# Patient Record
Sex: Male | Born: 1985 | Race: Black or African American | Hispanic: No | Marital: Single | Smoking: Never smoker
Health system: Southern US, Community
[De-identification: ages and names within clinical notes are randomized; demographics above are authoritative.]

## PROBLEM LIST (undated history)

## (undated) DIAGNOSIS — Z21 Asymptomatic human immunodeficiency virus [HIV] infection status: Secondary | ICD-10-CM

## (undated) DIAGNOSIS — B20 Human immunodeficiency virus [HIV] disease: Secondary | ICD-10-CM

## (undated) HISTORY — PX: TONSILLECTOMY: SUR1361

## (undated) HISTORY — PX: CIRCUMCISION: SUR203

## (undated) HISTORY — PX: INTUSSUSCEPTION REPAIR: SHX1847

---

## 2004-08-26 ENCOUNTER — Ambulatory Visit: Payer: Self-pay | Admitting: Family Medicine

## 2004-12-04 ENCOUNTER — Ambulatory Visit: Payer: Self-pay | Admitting: Family Medicine

## 2008-01-25 ENCOUNTER — Emergency Department (HOSPITAL_COMMUNITY): Admission: EM | Admit: 2008-01-25 | Discharge: 2008-01-26 | Payer: Self-pay | Admitting: Internal Medicine

## 2008-06-07 ENCOUNTER — Emergency Department (HOSPITAL_BASED_OUTPATIENT_CLINIC_OR_DEPARTMENT_OTHER): Admission: EM | Admit: 2008-06-07 | Discharge: 2008-06-07 | Payer: Self-pay | Admitting: Emergency Medicine

## 2008-07-07 ENCOUNTER — Emergency Department (HOSPITAL_BASED_OUTPATIENT_CLINIC_OR_DEPARTMENT_OTHER): Admission: EM | Admit: 2008-07-07 | Discharge: 2008-07-07 | Payer: Self-pay | Admitting: Emergency Medicine

## 2009-04-29 ENCOUNTER — Emergency Department (HOSPITAL_BASED_OUTPATIENT_CLINIC_OR_DEPARTMENT_OTHER): Admission: EM | Admit: 2009-04-29 | Discharge: 2009-04-29 | Payer: Self-pay | Admitting: Emergency Medicine

## 2010-05-22 ENCOUNTER — Emergency Department (HOSPITAL_BASED_OUTPATIENT_CLINIC_OR_DEPARTMENT_OTHER): Admission: EM | Admit: 2010-05-22 | Discharge: 2010-05-22 | Payer: Self-pay | Admitting: Emergency Medicine

## 2010-10-23 ENCOUNTER — Emergency Department (HOSPITAL_BASED_OUTPATIENT_CLINIC_OR_DEPARTMENT_OTHER)
Admission: EM | Admit: 2010-10-23 | Discharge: 2010-10-23 | Disposition: A | Discharge status: Home / Self Care | Payer: Managed Care, Other (non HMO) | Attending: Emergency Medicine | Admitting: Emergency Medicine

## 2010-10-23 DIAGNOSIS — J329 Chronic sinusitis, unspecified: Secondary | ICD-10-CM | POA: Insufficient documentation

## 2010-10-23 DIAGNOSIS — Z21 Asymptomatic human immunodeficiency virus [HIV] infection status: Secondary | ICD-10-CM | POA: Insufficient documentation

## 2010-10-23 DIAGNOSIS — R209 Unspecified disturbances of skin sensation: Secondary | ICD-10-CM | POA: Insufficient documentation

## 2010-10-23 DIAGNOSIS — R42 Dizziness and giddiness: Secondary | ICD-10-CM | POA: Insufficient documentation

## 2010-10-23 LAB — URINALYSIS, ROUTINE W REFLEX MICROSCOPIC
Glucose, UA: NEGATIVE mg/dL
Hgb urine dipstick: NEGATIVE
Ketones, ur: NEGATIVE mg/dL
Nitrite: NEGATIVE
Urobilinogen, UA: 0.2 mg/dL (ref 0.0–1.0)
pH: 6 (ref 5.0–8.0)

## 2010-10-23 LAB — BASIC METABOLIC PANEL
BUN: 7 mg/dL (ref 6–23)
Chloride: 107 mEq/L (ref 96–112)
Glucose, Bld: 89 mg/dL (ref 70–99)
Sodium: 144 mEq/L (ref 135–145)

## 2010-10-23 LAB — CBC
MCHC: 33.5 g/dL (ref 30.0–36.0)
MCV: 75.2 fL — ABNORMAL LOW (ref 78.0–100.0)
Platelets: 201 10*3/uL (ref 150–400)
RDW: 15.7 % — ABNORMAL HIGH (ref 11.5–15.5)

## 2010-10-23 LAB — DIFFERENTIAL
Basophils Absolute: 0 10*3/uL (ref 0.0–0.1)
Basophils Relative: 1 % (ref 0–1)
Eosinophils Absolute: 0 10*3/uL (ref 0.0–0.7)
Lymphocytes Relative: 37 % (ref 12–46)
Lymphs Abs: 2.5 10*3/uL (ref 0.7–4.0)
Monocytes Absolute: 0.5 10*3/uL (ref 0.1–1.0)

## 2010-10-30 LAB — URINALYSIS, ROUTINE W REFLEX MICROSCOPIC
Bilirubin Urine: NEGATIVE
Glucose, UA: NEGATIVE mg/dL

## 2010-10-30 LAB — COMPREHENSIVE METABOLIC PANEL
AST: 31 U/L (ref 0–37)
Albumin: 4.4 g/dL (ref 3.5–5.2)
Alkaline Phosphatase: 106 U/L (ref 39–117)
CO2: 23 mEq/L (ref 19–32)
Calcium: 9.5 mg/dL (ref 8.4–10.5)
Chloride: 106 mEq/L (ref 96–112)
Creatinine, Ser: 1.1 mg/dL (ref 0.4–1.5)
GFR calc Af Amer: >60 mL/min (ref >60)
Glucose, Bld: 88 mg/dL (ref 70–99)
Sodium: 141 mEq/L (ref 135–145)

## 2010-11-21 LAB — URINALYSIS, ROUTINE W REFLEX MICROSCOPIC
Bilirubin Urine: NEGATIVE
Glucose, UA: NEGATIVE mg/dL
Hgb urine dipstick: NEGATIVE
Specific Gravity, Urine: 1.023 (ref 1.005–1.030)
Urobilinogen, UA: 1 mg/dL (ref 0.0–1.0)

## 2010-11-21 LAB — URINE MICROSCOPIC-ADD ON

## 2010-11-21 LAB — URINE CULTURE: Colony Count: NO GROWTH

## 2010-11-21 LAB — GC/CHLAMYDIA PROBE AMP, GENITAL
Chlamydia, DNA Probe: NEGATIVE
GC Probe Amp, Genital: NEGATIVE

## 2010-11-21 LAB — GLUCOSE, CAPILLARY: Glucose-Capillary: 106 mg/dL — ABNORMAL HIGH (ref 70–99)

## 2011-04-12 ENCOUNTER — Encounter: Payer: Self-pay | Admitting: *Deleted

## 2011-04-12 ENCOUNTER — Emergency Department (HOSPITAL_BASED_OUTPATIENT_CLINIC_OR_DEPARTMENT_OTHER)
Admission: EM | Admit: 2011-04-12 | Discharge: 2011-04-12 | Disposition: A | Discharge status: Home / Self Care | Payer: Managed Care, Other (non HMO) | Attending: Emergency Medicine | Admitting: Emergency Medicine

## 2011-04-12 DIAGNOSIS — R21 Rash and other nonspecific skin eruption: Secondary | ICD-10-CM

## 2011-04-12 DIAGNOSIS — A6000 Herpesviral infection of urogenital system, unspecified: Secondary | ICD-10-CM

## 2011-04-12 MED ORDER — FLUCONAZOLE 150 MG PO TABS: 150.0000 mg | ORAL_TABLET | Freq: Once | ORAL | Status: AC

## 2011-04-12 NOTE — ED Notes (Signed)
Scrotal rash and pain x 1 week. Tried diaper cream and Lotrimin, but got worse.

## 2011-04-12 NOTE — ED Provider Notes (Signed)
History     CSN: [id number] Arrival date & time: 04/12/2011  6:20 PM  Chief Complaint  Patient presents with  . Rash   Patient is a 25 y.o. male presenting with rash. The history is provided by the patient.  Rash  This is a new problem. The current episode started more than 2 days ago. The problem has been gradually worsening. The problem is associated with nothing. There has been no fever. The fever has been present for 3 to 4 days. The rash is present on the genitalia. The pain is at a severity of 4/10. The pain is moderate. The pain has been constant since onset. Associated symptoms include itching. He has tried nothing for the symptoms. The treatment provided no relief. Risk factors include new medications.  Pt reports he has a rash on scrotum to rectum.  Pt has been using fungal spray and taking acyclovir.  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  History reviewed. No pertinent past medical history.  Past Surgical History  Procedure Date  . Intussusception repair   . Tonsillectomy   . Circumcision     History reviewed. No pertinent family history.  History  Substance Use Topics  . Smoking status: Never Smoker   . Smokeless tobacco: Not on file  . Alcohol Use: No      Review of Systems  Skin: Positive for itching and rash.  All other systems reviewed and are negative.    Physical Exam  BP 129/80  Pulse 88  Temp(Src) 98.4 F (36.9 C) (Oral)  Resp 20  Ht 5\' 11"  (1.803 m)  Wt 250 lb (113.399 kg)  BMI 34.87 kg/m2  SpO2 98%  Physical Exam  Nursing note and vitals reviewed. Constitutional: He is oriented to person, place, and time. He appears well-developed and well-nourished.  HENT:  Head: Normocephalic.  Eyes: Conjunctivae are normal. Pupils are equal, round, and reactive to light.  Neck: Normal range of motion. Neck supple.  Cardiovascular: Normal rate.   Pulmonary/Chest: Effort normal.  Abdominal: Soft.  Musculoskeletal: Normal range of  motion.  Neurological: He is alert and oriented to person, place, and time. He has normal reflexes.  Skin: Rash noted.  Psychiatric: He has a normal mood and affect.    ED Course  Procedures  MDM Gc and ct obtained and sent,  I think rash is fungal.  I will treat with diflucan.  Pt advised to see Dr. Renae Gloss for recheck.  Medical screening examination/treatment/procedure(s) were performed by non-physician practitioner and as supervising physician I was immediately available for consultation/collaboration. Osvaldo Human, M.D.    [place], [place] 04/12/11 1957  Carleene Cooper III, MD 04/13/11 1300

## 2011-04-13 LAB — GC/CHLAMYDIA PROBE AMP, GENITAL
Chlamydia, DNA Probe: NEGATIVE
GC Probe Amp, Genital: NEGATIVE

## 2011-05-19 LAB — URINALYSIS, ROUTINE W REFLEX MICROSCOPIC
Bilirubin Urine: NEGATIVE
Hgb urine dipstick: NEGATIVE
Ketones, ur: NEGATIVE
Protein, ur: NEGATIVE
Specific Gravity, Urine: 1.002 — ABNORMAL LOW
Urobilinogen, UA: 0.2

## 2011-05-19 LAB — DIFFERENTIAL
Basophils Absolute: 0
Basophils Relative: 0
Eosinophils Relative: 0
Lymphs Abs: 2.4
Monocytes Absolute: 0.9
Monocytes Relative: 11
Neutro Abs: 4.7

## 2011-05-19 LAB — COMPREHENSIVE METABOLIC PANEL
Albumin: 4.3
BUN: 12
Chloride: 103
Creatinine, Ser: 1.1
Total Protein: 8.1

## 2011-05-19 LAB — CBC
MCHC: 32.9
WBC: 8

## 2011-08-03 ENCOUNTER — Emergency Department (HOSPITAL_BASED_OUTPATIENT_CLINIC_OR_DEPARTMENT_OTHER)
Admission: EM | Admit: 2011-08-03 | Discharge: 2011-08-03 | Disposition: A | Discharge status: Home / Self Care | Payer: Managed Care, Other (non HMO) | Attending: Emergency Medicine | Admitting: Emergency Medicine

## 2011-08-03 ENCOUNTER — Encounter (HOSPITAL_BASED_OUTPATIENT_CLINIC_OR_DEPARTMENT_OTHER): Payer: Self-pay | Admitting: *Deleted

## 2011-08-03 DIAGNOSIS — R300 Dysuria: Secondary | ICD-10-CM | POA: Insufficient documentation

## 2011-08-03 DIAGNOSIS — Z21 Asymptomatic human immunodeficiency virus [HIV] infection status: Secondary | ICD-10-CM | POA: Insufficient documentation

## 2011-08-03 DIAGNOSIS — Z202 Contact with and (suspected) exposure to infections with a predominantly sexual mode of transmission: Secondary | ICD-10-CM | POA: Insufficient documentation

## 2011-08-03 HISTORY — DX: Asymptomatic human immunodeficiency virus (hiv) infection status: Z21

## 2011-08-03 HISTORY — DX: Human immunodeficiency virus (HIV) disease: B20

## 2011-08-03 LAB — URINALYSIS, ROUTINE W REFLEX MICROSCOPIC
Hgb urine dipstick: NEGATIVE
Leukocytes, UA: NEGATIVE
Specific Gravity, Urine: 1.035 — ABNORMAL HIGH (ref 1.005–1.030)
Urobilinogen, UA: 0.2 mg/dL (ref 0.0–1.0)

## 2011-08-03 MED ORDER — AZITHROMYCIN 250 MG PO TABS
1000.0000 mg | ORAL_TABLET | Freq: Once | ORAL | Status: AC
  Administered 2011-08-03: 1000 mg via ORAL
  Filled 2011-08-03: qty 4

## 2011-08-03 MED ORDER — CEFTRIAXONE SODIUM 250 MG IJ SOLR
250.0000 mg | Freq: Once | INTRAMUSCULAR | Status: AC
  Administered 2011-08-03: 250 mg via INTRAMUSCULAR
  Filled 2011-08-03: qty 250

## 2011-08-03 NOTE — ED Provider Notes (Signed)
History  This chart was scribed for Gerhard Munch, MD by Bennett Scrape. This patient was seen in room MH08/MH08 and the patient's care was started at 8:10PM.  CSN: [id number] Arrival date & time: 08/03/2011  7:58 PM   First MD Initiated Contact with Patient 08/03/11 2002      Chief Complaint  Patient presents with  . Exposure to STD    The history is provided by the patient. No language interpreter was used.    David Blake is a 25 y.o. male who presents to the Emergency Department complaining of 5 days of dysuria with associated tingling sensations in both testicles while urinating. Pt also states that he noticed enlarged lymph nodes in the left auxillary which he took as a sign of infection. He denies any modifying factors and has not taken any medication to improve his symptoms. He reports feeling similar symptoms with previous UTIs. Pt denies discoloration, penile discharge, penile pain and swelling, abdominal pain and chest pain as associated symptoms. Pt also c/o productive coughing described as mucous. Pt has a h/o HIV and takes Atripla regularly and Valtrex during outbreaks. Pt states that he has had no other confirmed STDs in the past or recently. Pt states that he recently had unprotected sex with his partner.  Past Medical History  Diagnosis Date  . HIV (human immunodeficiency virus infection)     Past Surgical History  Procedure Date  . Intussusception repair   . Tonsillectomy   . Circumcision     History reviewed. No pertinent family history.  History  Substance Use Topics  . Smoking status: Never Smoker   . Smokeless tobacco: Not on file  . Alcohol Use: No      Review of Systems  Constitutional: Negative for fever.  HENT: Negative for congestion, sore throat and rhinorrhea.   Respiratory: Positive for cough.   Cardiovascular: Negative for chest pain.  Gastrointestinal: Negative for nausea, vomiting, abdominal pain and diarrhea.  Genitourinary:  Positive for dysuria and testicular pain (with urination). Negative for discharge, penile swelling, scrotal swelling, genital sores and penile pain.  Skin: Negative for color change, rash and wound.  Psychiatric/Behavioral: Negative for confusion.  All other systems reviewed and are negative.    Allergies  Ciprofloxacin and Peanut butter flavor  Home Medications   Current Outpatient Rx  Name Route Sig Dispense Refill  . BEE POLLEN 1000 MG PO TABS Oral Take 2 tablets by mouth daily.      . EFAVIRENZ-EMTRICITAB-TENOFOVIR 600-200-300 MG PO TABS Oral Take 1 tablet by mouth at bedtime.      Marland Kitchen VALACYCLOVIR HCL 500 MG PO TABS Oral Take 500 mg by mouth 2 (two) times daily as needed. outbreak       Triage Vitals: BP 155/91  Pulse 79  Temp(Src) 97.8 F (36.6 C) (Oral)  Resp 16  Ht 5\' 11"  (1.803 m)  Wt 253 lb (114.76 kg)  BMI 35.29 kg/m2  SpO2 100%  Physical Exam  Nursing note and vitals reviewed. Constitutional: He is oriented to person, place, and time. He appears well-developed and well-nourished.  HENT:  Head: Normocephalic and atraumatic.  Eyes: Conjunctivae and EOM are normal.  Neck: Normal range of motion. Neck supple.  Cardiovascular: Normal rate, regular rhythm and normal heart sounds.   Pulmonary/Chest: Effort normal and breath sounds normal. No respiratory distress.  Abdominal: Soft. Bowel sounds are normal. There is no tenderness.  Genitourinary: Penis normal.       Testicles are normal.  Musculoskeletal: Normal  range of motion.       Prominent left auxiliary lymph nodes  Neurological: He is alert and oriented to person, place, and time. No cranial nerve deficit.  Skin: Skin is warm and dry.  Psychiatric: He has a normal mood and affect. His behavior is normal.    ED Course  Procedures (including critical care time)  DIAGNOSTIC STUDIES: Oxygen Saturation is 100% on room air, normal by my interpretation.    COORDINATION OF CARE: 8:17PM-Discussed treatment plan  with pt at bedside and pt agreed to plan.   Labs Reviewed  URINALYSIS, ROUTINE W REFLEX MICROSCOPIC - Abnormal; Notable for the following:    Color, Urine AMBER (*) BIOCHEMICALS MAY BE AFFECTED BY COLOR   Specific Gravity, Urine 1.035 (*)    Bilirubin Urine SMALL (*)    Ketones, ur 15 (*)    All other components within normal limits  GC/CHLAMYDIA PROBE AMP, URINE   No results found.   No diagnosis found.    MDM  I personally performed the services described in this documentation, which was scribed in my presence. The recorded information has been reviewed and considered.  This generally well 25 year old male with HIV presents with several days of dysuria. On exam the patient is in no distress with no notable genital physical exam findings. The patient does have palpable lymphadenopathy in his left axilla. Absent fever, abnormal vital signs, signs of distress there is little concern for systemic illness. The patient was apparently treated for gonorrhea/Chlamydia, and his urinalysis did not demonstrate a UTI. Cultures / PCR for GC are pending     Gerhard Munch, MD 08/03/11 2122

## 2011-08-03 NOTE — ED Notes (Signed)
Pt c/o burning with urination and ? STD

## 2011-08-03 NOTE — ED Notes (Signed)
Pt requesting to speak with MD prior to discharge. MD made aware.

## 2011-08-05 LAB — GC/CHLAMYDIA PROBE AMP, URINE: Chlamydia, Swab/Urine, PCR: NEGATIVE

## 2012-04-14 ENCOUNTER — Emergency Department (HOSPITAL_BASED_OUTPATIENT_CLINIC_OR_DEPARTMENT_OTHER)
Admission: EM | Admit: 2012-04-14 | Discharge: 2012-04-14 | Disposition: A | Discharge status: Home / Self Care | Payer: Managed Care, Other (non HMO) | Attending: Emergency Medicine | Admitting: Emergency Medicine

## 2012-04-14 ENCOUNTER — Encounter (HOSPITAL_BASED_OUTPATIENT_CLINIC_OR_DEPARTMENT_OTHER): Payer: Self-pay | Admitting: *Deleted

## 2012-04-14 DIAGNOSIS — Z202 Contact with and (suspected) exposure to infections with a predominantly sexual mode of transmission: Secondary | ICD-10-CM

## 2012-04-14 DIAGNOSIS — Z21 Asymptomatic human immunodeficiency virus [HIV] infection status: Secondary | ICD-10-CM | POA: Insufficient documentation

## 2012-04-14 DIAGNOSIS — N400 Enlarged prostate without lower urinary tract symptoms: Secondary | ICD-10-CM | POA: Insufficient documentation

## 2012-04-14 LAB — URINALYSIS, ROUTINE W REFLEX MICROSCOPIC
Glucose, UA: NEGATIVE mg/dL
Leukocytes, UA: NEGATIVE
Nitrite: NEGATIVE
Specific Gravity, Urine: 1.017 (ref 1.005–1.030)
pH: 6 (ref 5.0–8.0)

## 2012-04-14 MED ORDER — HYDROCODONE-ACETAMINOPHEN 5-500 MG PO TABS: 1.0000 | ORAL_TABLET | Freq: Four times a day (QID) | ORAL | Status: AC | PRN

## 2012-04-14 MED ORDER — DOXYCYCLINE HYCLATE 100 MG PO CAPS: 100.0000 mg | ORAL_CAPSULE | Freq: Two times a day (BID) | ORAL | Status: AC

## 2012-04-14 NOTE — ED Provider Notes (Addendum)
History     CSN: [id number]  Arrival date & time 04/14/12  1827   First MD Initiated Contact with Patient 04/14/12 1857      Chief Complaint  Patient presents with  . Urinary Tract Infection    (Consider location/radiation/quality/duration/timing/severity/associated sxs/prior treatment) Patient is a 26 y.o. male presenting with dysuria. The history is provided by the patient.  Dysuria  This is a recurrent problem. Episode onset: 1 month. The problem occurs every urination (with testicular and groin pain). The problem has been gradually worsening. The quality of the pain is described as shooting. The pain is at a severity of 6/10. The pain is moderate. There has been no fever. He is sexually active. There is no history of pyelonephritis. Pertinent negatives include no discharge, no frequency, no hematuria, no hesitancy, no urgency and no flank pain. He has tried antibiotics for the symptoms.    Past Medical History  Diagnosis Date  . HIV (human immunodeficiency virus infection)     Past Surgical History  Procedure Date  . Intussusception repair   . Tonsillectomy   . Circumcision     No family history on file.  History  Substance Use Topics  . Smoking status: Never Smoker   . Smokeless tobacco: Not on file  . Alcohol Use: No      Review of Systems  Constitutional: Negative for fever.  Genitourinary: Positive for dysuria. Negative for hesitancy, urgency, frequency, hematuria and flank pain.  All other systems reviewed and are negative.    Allergies  Ciprofloxacin and Peanut butter flavor  Home Medications   Current Outpatient Rx  Name Route Sig Dispense Refill  . DOXYCYCLINE HYCLATE 100 MG PO TABS Oral Take 100 mg by mouth 2 (two) times daily.    . EFAVIRENZ-EMTRICITAB-TENOFOVIR 600-200-300 MG PO TABS Oral Take 1 tablet by mouth at bedtime.      Marland Kitchen VALACYCLOVIR HCL 500 MG PO TABS Oral Take 500 mg by mouth 2 (two) times daily as needed. outbreak       BP  150/88  Pulse 81  Temp 98.5 F (36.9 C) (Oral)  Resp 20  SpO2 99%  Physical Exam  Nursing note and vitals reviewed. Constitutional: He is oriented to person, place, and time. He appears well-developed and well-nourished. No distress.  HENT:  Head: Normocephalic and atraumatic.  Mouth/Throat: Oropharynx is clear and moist.  Eyes: Conjunctivae and EOM are normal. Pupils are equal, round, and reactive to light.  Neck: Normal range of motion. Neck supple.  Pulmonary/Chest: Effort normal.  Abdominal: Soft. He exhibits no distension. There is no tenderness. There is no rebound and no guarding.  Genitourinary: Testes normal and penis normal. Prostate is tender. Prostate is not enlarged. Right testis shows no swelling and no tenderness. Left testis shows no swelling and no tenderness. Circumcised. No penile tenderness. No discharge found.       Mild prostate tenderness  Musculoskeletal: Normal range of motion. He exhibits no edema and no tenderness.  Neurological: He is alert and oriented to person, place, and time.  Skin: Skin is warm and dry. No rash noted. No erythema.  Psychiatric: He has a normal mood and affect. His behavior is normal.    ED Course  Procedures (including critical care time)   Labs Reviewed  URINALYSIS, ROUTINE W REFLEX MICROSCOPIC  GC/CHLAMYDIA PROBE AMP, GENITAL   No results found.   1. Exposure to STD   2. Prostatism       MDM   Patient with  testicular pain and pain with urination. He recently started doxycycline test for STD was done inappropriately and was never sent. Patient has one unprotected sexual encounter of this month. On exam he has some mild tenderness in his prostate but no edema. He has no penile drainage or swelling in his testicles. There is no abdominal pain. Patient has only taken 2-1/2 days of doxycycline and was encouraged to continue. A GC Chlamydia swab was done. Patient will followup with his infectious disease doctor. Currently he  is taking a triple and states his viral load is undetectable and his CD4 count is in the 800s.        Gwyneth Sprout, MD 04/14/12 1915  Gwyneth Sprout, MD 04/14/12 Ernestina [place]  Gwyneth Sprout, MD 04/14/12 1925

## 2012-04-14 NOTE — ED Notes (Signed)
Had cultures obtained but they were not ran due to error in lab.  He has also developed a sore throat.  The MD called in doxycycline but has not noticed any improvement.

## 2012-04-14 NOTE — ED Notes (Signed)
States he thinks he has a UTI. Groin pain into his testicles. He had an STD test with his MD last week but the test was never sent to the lab. His MD called in a Rx for Doxycycline and he has been treated x 2 days.

## 2012-04-15 LAB — GC/CHLAMYDIA PROBE AMP, GENITAL: GC Probe Amp, Genital: NEGATIVE

## 2012-09-21 ENCOUNTER — Emergency Department (HOSPITAL_BASED_OUTPATIENT_CLINIC_OR_DEPARTMENT_OTHER)
Admission: EM | Admit: 2012-09-21 | Discharge: 2012-09-21 | Disposition: A | Discharge status: Home / Self Care | Payer: Managed Care, Other (non HMO) | Attending: Emergency Medicine | Admitting: Emergency Medicine

## 2012-09-21 ENCOUNTER — Encounter (HOSPITAL_BASED_OUTPATIENT_CLINIC_OR_DEPARTMENT_OTHER): Payer: Self-pay

## 2012-09-21 DIAGNOSIS — Z21 Asymptomatic human immunodeficiency virus [HIV] infection status: Secondary | ICD-10-CM | POA: Insufficient documentation

## 2012-09-21 DIAGNOSIS — Z202 Contact with and (suspected) exposure to infections with a predominantly sexual mode of transmission: Secondary | ICD-10-CM | POA: Insufficient documentation

## 2012-09-21 DIAGNOSIS — Z79899 Other long term (current) drug therapy: Secondary | ICD-10-CM | POA: Insufficient documentation

## 2012-09-21 DIAGNOSIS — M79669 Pain in unspecified lower leg: Secondary | ICD-10-CM

## 2012-09-21 LAB — RPR: RPR Ser Ql: NONREACTIVE

## 2012-09-21 MED ORDER — IBUPROFEN 800 MG PO TABS: 800.0000 mg | ORAL_TABLET | Freq: Three times a day (TID) | ORAL | Status: DC
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
## 2012-09-21 NOTE — ED Notes (Deleted)
Possible abscess on buttock x 2 days.

## 2012-09-21 NOTE — ED Notes (Signed)
Pt reports left leg pain x 2 days and also asking for an STD check due to possible exposure.  Denies any symptoms related to STD.

## 2012-09-22 LAB — GC/CHLAMYDIA PROBE AMP: CT Probe RNA: NEGATIVE

## 2012-09-22 NOTE — ED Provider Notes (Signed)
Medical screening examination/treatment/procedure(s) were performed by non-physician practitioner and as supervising physician I was immediately available for consultation/collaboration.   Rodarius Kichline B. Kelli Robeck, MD 09/22/12 0702 

## 2012-11-01 ENCOUNTER — Emergency Department (HOSPITAL_BASED_OUTPATIENT_CLINIC_OR_DEPARTMENT_OTHER)
Admission: EM | Admit: 2012-11-01 | Discharge: 2012-11-01 | Disposition: A | Discharge status: Home / Self Care | Payer: Managed Care, Other (non HMO) | Attending: Emergency Medicine | Admitting: Emergency Medicine

## 2012-11-01 ENCOUNTER — Emergency Department (HOSPITAL_BASED_OUTPATIENT_CLINIC_OR_DEPARTMENT_OTHER): Payer: Managed Care, Other (non HMO)

## 2012-11-01 DIAGNOSIS — S0083XA Contusion of other part of head, initial encounter: Secondary | ICD-10-CM

## 2012-11-01 DIAGNOSIS — S0003XA Contusion of scalp, initial encounter: Secondary | ICD-10-CM | POA: Insufficient documentation

## 2012-11-01 DIAGNOSIS — Y92410 Unspecified street and highway as the place of occurrence of the external cause: Secondary | ICD-10-CM | POA: Insufficient documentation

## 2012-11-01 DIAGNOSIS — Z21 Asymptomatic human immunodeficiency virus [HIV] infection status: Secondary | ICD-10-CM | POA: Insufficient documentation

## 2012-11-01 DIAGNOSIS — S139XXA Sprain of joints and ligaments of unspecified parts of neck, initial encounter: Secondary | ICD-10-CM | POA: Insufficient documentation

## 2012-11-01 DIAGNOSIS — Z79899 Other long term (current) drug therapy: Secondary | ICD-10-CM | POA: Insufficient documentation

## 2012-11-01 DIAGNOSIS — Y9389 Activity, other specified: Secondary | ICD-10-CM | POA: Insufficient documentation

## 2012-11-01 MED ORDER — HYDROCODONE-ACETAMINOPHEN 5-325 MG PO TABS
1.0000 | ORAL_TABLET | Freq: Once | ORAL | Status: AC
  Administered 2012-11-01: 1 via ORAL
  Filled 2012-11-01: qty 1

## 2012-11-01 MED ORDER — HYDROCODONE-ACETAMINOPHEN 5-325 MG PO TABS: 1.0000 | ORAL_TABLET | Freq: Four times a day (QID) | ORAL | Status: DC | PRN

## 2012-11-01 NOTE — ED Notes (Signed)
VS with EMS  135/89  HR 72  Resp. 16 GCS 15

## 2012-11-01 NOTE — ED Notes (Signed)
Patient back from radiology.
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
## 2012-12-05 ENCOUNTER — Encounter: Payer: Self-pay | Admitting: Podiatry

## 2012-12-05 ENCOUNTER — Ambulatory Visit (INDEPENDENT_AMBULATORY_CARE_PROVIDER_SITE_OTHER): Payer: Managed Care, Other (non HMO) | Admitting: Podiatry

## 2012-12-05 VITALS — BP 126/91 | HR 74 | Ht 72.0 in | Wt 268.0 lb

## 2012-12-05 DIAGNOSIS — M21969 Unspecified acquired deformity of unspecified lower leg: Secondary | ICD-10-CM

## 2012-12-05 DIAGNOSIS — M722 Plantar fascial fibromatosis: Secondary | ICD-10-CM

## 2012-12-05 DIAGNOSIS — M25579 Pain in unspecified ankle and joints of unspecified foot: Secondary | ICD-10-CM

## 2012-12-05 NOTE — Progress Notes (Signed)
Subjective: 27 y.o. year old male patient presents requesting toe nails checked and for a new pair of Orthotics that could be made narrower than before.  Stated that Orthotics helped him. If he is not wearing orthotics, after 20 minutes on feet, he start having pain on his feet at arch area.  Previously patient was prescribed with [employer]otics for frequent Shin Splint, Tight Achilles tendon, and for elevated first ray in 07/24/2011.   Review of Systems - General ROS: negative for - chills, fatigue, fever, hot flashes, night sweats, sleep disturbance, weight gain or weight loss Ophthalmic ROS: negative ENT ROS: negative Allergy and Immunology ROS: Sinus and nasal allergies year around. Hematological and Lymphatic ROS: negative Endocrine ROS: negative Respiratory ROS: no cough, shortness of breath, or wheezing Cardiovascular ROS: no chest pain or dyspnea on exertion Gastrointestinal ROS: no abdominal pain, change in bowel habits, or black or bloody stools Genito-Urinary ROS: no dysuria, trouble voiding, or hematuria Musculoskeletal ROS: negative Neurological ROS: negative Dermatological ROS: negative. Patient denies having any Hematological issues.  Objective: Dermatologic: Mild discolored nails x 10.   Vascular: Pedal pulses are all palpable.  Orthopedic: Tight Achilles tendon with knee extended bilateral. Limited first MPJ dorsiflexion when forefoot is loaded. Neurologic: All epicritic and tactile sensations grossly intact. Radiographic examination reveal:  AP view: Mild increase in the first intermetatarsal angle and the Calcaneocuboid joint lateral deviation angle. Fibular sesamoid potion at 4, bilateral. Lateral view: Positive of anterior advancement of Talar head with anterior displacement of smooth lazy S shape Talocalcaneal joint line, bilateral. No other acute changes seen and all other osseous and articular surfaces are within normal in forefoot and rearfoot.    Assessment: Plantar fasciitis bilateral.  Ankle Equinus bilateral.  Functional Hallux Limitus bilateral.   Treatment/Plan: Casted for Orthotics. Advised to stretch Achilles tendon. X-rays bilateral done.

## 2014-05-01 ENCOUNTER — Encounter: Payer: Self-pay | Admitting: Podiatry

## 2014-05-01 ENCOUNTER — Ambulatory Visit (INDEPENDENT_AMBULATORY_CARE_PROVIDER_SITE_OTHER): Payer: Managed Care, Other (non HMO) | Admitting: Podiatry

## 2014-05-01 VITALS — BP 141/87 | HR 72 | Ht 72.0 in | Wt 275.0 lb

## 2014-05-01 DIAGNOSIS — M722 Plantar fascial fibromatosis: Secondary | ICD-10-CM

## 2014-05-01 DIAGNOSIS — M21969 Unspecified acquired deformity of unspecified lower leg: Secondary | ICD-10-CM

## 2014-05-01 NOTE — Progress Notes (Signed)
Subjective: 28 y.o. year old male patient presents requesting toe nails checked and for a new pair of Orthotics. Has not done any stretch exercise. No change since last visit,   Objective: Dermatologic: Mild discolored nails x 10.  Vascular: Pedal pulses are all palpable.  Orthopedic: Tight Achilles tendon with knee extended bilateral.  Limited first MPJ dorsiflexion when forefoot is loaded.  Neurologic: All epicritic and tactile sensations grossly intact.    Assessment:  Onychocryptosis. Plantar fasciitis bilateral.  Ankle Equinus bilateral.  Functional Hallux Limitus bilateral.   Treatment/Plan: Debrided all nails. Casted for Orthotics.  Advised to stretch Achilles tendon.

## 2014-05-01 NOTE — Patient Instructions (Signed)
Seen for new pair of orthotics and toe nails. All nails debrided. Both feet casted for Orthotics. Need to do stretch exercise for tight Achilles tendon bilateral. Will contact when orthotics are ready.

## 2014-06-20 ENCOUNTER — Emergency Department (HOSPITAL_BASED_OUTPATIENT_CLINIC_OR_DEPARTMENT_OTHER)
Admission: EM | Admit: 2014-06-20 | Discharge: 2014-06-20 | Disposition: A | Discharge status: Home / Self Care | Payer: Managed Care, Other (non HMO) | Attending: Emergency Medicine | Admitting: Emergency Medicine

## 2014-06-20 ENCOUNTER — Encounter (HOSPITAL_BASED_OUTPATIENT_CLINIC_OR_DEPARTMENT_OTHER): Payer: Self-pay | Admitting: *Deleted

## 2014-06-20 DIAGNOSIS — Z791 Long term (current) use of non-steroidal anti-inflammatories (NSAID): Secondary | ICD-10-CM | POA: Insufficient documentation

## 2014-06-20 DIAGNOSIS — N390 Urinary tract infection, site not specified: Secondary | ICD-10-CM | POA: Insufficient documentation

## 2014-06-20 DIAGNOSIS — B009 Herpesviral infection, unspecified: Secondary | ICD-10-CM | POA: Insufficient documentation

## 2014-06-20 DIAGNOSIS — Z79899 Other long term (current) drug therapy: Secondary | ICD-10-CM | POA: Insufficient documentation

## 2014-06-20 DIAGNOSIS — Z21 Asymptomatic human immunodeficiency virus [HIV] infection status: Secondary | ICD-10-CM | POA: Insufficient documentation

## 2014-06-20 LAB — URINALYSIS, ROUTINE W REFLEX MICROSCOPIC
Bilirubin Urine: NEGATIVE
Glucose, UA: NEGATIVE mg/dL
Hgb urine dipstick: NEGATIVE
KETONES UR: NEGATIVE mg/dL
NITRITE: NEGATIVE
PH: 6.5 (ref 5.0–8.0)
Protein, ur: NEGATIVE mg/dL
SPECIFIC GRAVITY, URINE: 1.02 (ref 1.005–1.030)
Urobilinogen, UA: 1 mg/dL (ref 0.0–1.0)

## 2014-06-20 LAB — URINE MICROSCOPIC-ADD ON

## 2014-06-20 MED ORDER — VALACYCLOVIR HCL 500 MG PO TABS: 500.0000 mg | ORAL_TABLET | Freq: Two times a day (BID) | ORAL | Status: AC

## 2014-06-20 MED ORDER — AZITHROMYCIN 1 G PO PACK: 1.0000 g | PACK | Freq: Once | ORAL | Status: DC

## 2014-06-20 MED ORDER — CEFTRIAXONE SODIUM 250 MG IJ SOLR
250.0000 mg | Freq: Once | INTRAMUSCULAR | Status: AC
  Administered 2014-06-20: 250 mg via INTRAMUSCULAR
  Filled 2014-06-20: qty 250

## 2014-06-20 MED ORDER — SULFAMETHOXAZOLE-TRIMETHOPRIM 800-160 MG PO TABS: 1.0000 | ORAL_TABLET | Freq: Two times a day (BID) | ORAL | Status: DC

## 2014-06-20 MED ORDER — SULFAMETHOXAZOLE-TRIMETHOPRIM 800-160 MG PO TABS
1.0000 | ORAL_TABLET | Freq: Once | ORAL | Status: AC
  Administered 2014-06-20: 1 via ORAL
  Filled 2014-06-20: qty 1

## 2014-06-20 MED ORDER — AZITHROMYCIN 250 MG PO TABS
1000.0000 mg | ORAL_TABLET | Freq: Once | ORAL | Status: AC
  Administered 2014-06-20: 1000 mg via ORAL
  Filled 2014-06-20: qty 4

## 2014-06-20 NOTE — ED Notes (Addendum)
Pt c/o freq painful urination x 5 days pt also requesting refill on valtrex

## 2014-06-20 NOTE — Discharge Instructions (Signed)
Take bactrim as prescribed.   Take valtrex twice a day for 10 days.   Follow up with your doctor.   Return to ER if you have fever, flank pain, penile discharge, worse herpes infection.

## 2014-06-20 NOTE — ED Provider Notes (Signed)
CSN: [id number]     Arrival date & time 06/20/14  2157 History  This chart was scribed for David Blake H Akita Maxim, MD by Murriel HopperAlec Bankhead, ED Scribe. This patient was seen in room MH03/MH03 and the patient's care was started at 10:42 PM.    Chief Complaint  Patient presents with  . Dysuria   The history is provided by the patient. No language interpreter was used.     HPI Comments: David Blake is a 28 y.o. male who presents to the Emergency Department complaining of a constant dysuria that began 5 days ago. Pt does not have history of UTI or STD. Pt is HIV positive (CD4 count nl, viral load undetectable) and has Herpes simplex 2. Pt denies penile discharge. Pt states that he is occasionally sexually active and always uses protection. Pt notes that his last sexual encounter was around one month ago. Denies hematuria or flank pain or fever. He also has more itchiness around anus and felt that his herpes may be acting up.    Past Medical History  Diagnosis Date  . HIV (human immunodeficiency virus infection)    Past Surgical History  Procedure Laterality Date  . Intussusception repair    . Tonsillectomy    . Circumcision     History reviewed. No pertinent family history. History  Substance Use Topics  . Smoking status: Never Smoker   . Smokeless tobacco: Never Used  . Alcohol Use: No    Review of Systems  Genitourinary: Positive for dysuria.  All other systems reviewed and are negative.     Allergies  Ciprofloxacin and Peanut butter flavor  Home Medications   Prior to Admission medications   Medication Sig Start Date End Date Taking? Authorizing Provider  buPROPion (WELLBUTRIN) 100 MG tablet Take 100 mg by mouth 2 (two) times daily.   Yes Historical Provider, MD  finasteride (PROPECIA) 1 MG tablet Take 5 mg by mouth daily.   Yes Historical Provider, MD  efavirenz-emtrictabine-tenofovir (ATRIPLA) 600-200-300 MG per tablet Take 1 tablet by mouth at bedtime.      Historical Provider,  MD  HYDROcodone-acetaminophen (NORCO/VICODIN) 5-325 MG per tablet Take 1-2 tablets by mouth every 6 (six) hours as needed for pain. 11/01/12   Carlisle BeersJohn L Molpus, MD  ibuprofen (ADVIL,MOTRIN) 800 MG tablet Take 1 tablet (800 mg total) by mouth 3 (three) times daily. 09/21/12   Garlon HatchetLisa M Sanders, PA-C  valACYclovir (VALTREX) 500 MG tablet Take 500 mg by mouth 2 (two) times daily as needed. outbreak     Historical Provider, MD   BP 126/87 mmHg  Pulse 80  Temp(Src) 98 F (36.7 C) (Oral)  Resp 20  Ht 6' (1.829 m)  Wt 250 lb (113.399 kg)  BMI 33.90 kg/m2  SpO2 98% Physical Exam  Constitutional: He is oriented to person, place, and time. He appears well-developed and well-nourished. No distress.  HENT:  Head: Normocephalic.  Eyes: Conjunctivae are normal. Pupils are equal, round, and reactive to light. No scleral icterus.  Neck: Normal range of motion. Neck supple. No thyromegaly present.  Cardiovascular: Normal rate and regular rhythm.  Exam reveals no gallop and no friction rub.   No murmur heard. Pulmonary/Chest: Effort normal and breath sounds normal. No respiratory distress. He has no wheezes. He has no rales.  Abdominal: Soft. Bowel sounds are normal. He exhibits no distension. There is no tenderness. There is no rebound.  Genitourinary:  No penile discharge  Musculoskeletal: Normal range of motion.  Neurological: He is alert  and oriented to person, place, and time.  Skin: Skin is warm and dry. No rash noted.  No obvious lesions   Psychiatric: He has a normal mood and affect. His behavior is normal.    ED Course  Procedures (including critical care time)  DIAGNOSTIC STUDIES: Oxygen Saturation is 98% on RA, normal by my interpretation.    COORDINATION OF CARE: 10:47 PM Discussed treatment plan with pt at bedside and pt agreed to plan.   Labs Review Labs Reviewed  URINALYSIS, ROUTINE W REFLEX MICROSCOPIC - Abnormal; Notable for the following:    Leukocytes, UA TRACE (*)    All other  components within normal limits  URINE CULTURE  GC/CHLAMYDIA PROBE AMP  URINE MICROSCOPIC-ADD ON    Imaging Review No results found.   EKG Interpretation None      MDM   Final diagnoses:  None   David Blake is a 28 y.o. male here with dysuria, possible herpes. GC/chlamydia sent. UA ? UTI. Given that he has symptoms, will give bactrim empirically. Also empirically treated for STDs. I don't see any skin lesions suggesting herpes but may be early so will give a course of valtrex. Vitals stable, no signs of pyelo. Will d/c home.   I personally performed the services described in this documentation, which was scribed in my presence. The recorded information has been reviewed and is accurate.   David Blake H Ethyl Vila, MD 06/20/14 [phone]

## 2014-06-22 ENCOUNTER — Ambulatory Visit: Payer: Managed Care, Other (non HMO) | Admitting: Podiatry

## 2014-06-22 LAB — URINE CULTURE: Colony Count: 7000

## 2014-06-22 LAB — GC/CHLAMYDIA PROBE AMP
CT PROBE, AMP APTIMA: NEGATIVE
GC Probe RNA: NEGATIVE
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
## 2015-05-15 DIAGNOSIS — B20 Human immunodeficiency virus [HIV] disease: Secondary | ICD-10-CM | POA: Insufficient documentation

## 2015-05-15 DIAGNOSIS — E559 Vitamin D deficiency, unspecified: Secondary | ICD-10-CM | POA: Insufficient documentation

## 2015-05-15 DIAGNOSIS — R7309 Other abnormal glucose: Secondary | ICD-10-CM | POA: Insufficient documentation

## 2015-05-15 DIAGNOSIS — F3289 Other specified depressive episodes: Secondary | ICD-10-CM | POA: Insufficient documentation

## 2015-05-15 DIAGNOSIS — J309 Allergic rhinitis, unspecified: Secondary | ICD-10-CM | POA: Insufficient documentation

## 2019-07-17 ENCOUNTER — Emergency Department (HOSPITAL_COMMUNITY): Payer: No Typology Code available for payment source

## 2019-07-17 ENCOUNTER — Emergency Department (HOSPITAL_COMMUNITY)
Admission: EM | Admit: 2019-07-17 | Discharge: 2019-07-17 | Disposition: A | Discharge status: Home / Self Care | Payer: No Typology Code available for payment source | Attending: Emergency Medicine | Admitting: Emergency Medicine

## 2019-07-17 ENCOUNTER — Encounter (HOSPITAL_COMMUNITY): Payer: Self-pay

## 2019-07-17 DIAGNOSIS — S3991XA Unspecified injury of abdomen, initial encounter: Secondary | ICD-10-CM | POA: Insufficient documentation

## 2019-07-17 DIAGNOSIS — Y939 Activity, unspecified: Secondary | ICD-10-CM | POA: Insufficient documentation

## 2019-07-17 DIAGNOSIS — Z21 Asymptomatic human immunodeficiency virus [HIV] infection status: Secondary | ICD-10-CM | POA: Insufficient documentation

## 2019-07-17 DIAGNOSIS — S060X1A Concussion with loss of consciousness of 30 minutes or less, initial encounter: Secondary | ICD-10-CM

## 2019-07-17 DIAGNOSIS — Y999 Unspecified external cause status: Secondary | ICD-10-CM | POA: Insufficient documentation

## 2019-07-17 DIAGNOSIS — Y929 Unspecified place or not applicable: Secondary | ICD-10-CM | POA: Diagnosis not present

## 2019-07-17 DIAGNOSIS — Z79899 Other long term (current) drug therapy: Secondary | ICD-10-CM | POA: Insufficient documentation

## 2019-07-17 DIAGNOSIS — Z23 Encounter for immunization: Secondary | ICD-10-CM | POA: Insufficient documentation

## 2019-07-17 DIAGNOSIS — S50812A Abrasion of left forearm, initial encounter: Secondary | ICD-10-CM | POA: Diagnosis not present

## 2019-07-17 DIAGNOSIS — S59912A Unspecified injury of left forearm, initial encounter: Secondary | ICD-10-CM | POA: Diagnosis present

## 2019-07-17 LAB — ETHANOL: Alcohol, Ethyl (B): <10 mg/dL (ref <10)

## 2019-07-17 LAB — CBC
HCT: 48.7 % (ref 39.0–52.0)
Hemoglobin: 15.6 g/dL (ref 13.0–17.0)
MCH: 25.2 pg — ABNORMAL LOW (ref 26.0–34.0)
MCHC: 32 g/dL (ref 30.0–36.0)
MCV: 78.8 fL — ABNORMAL LOW (ref 80.0–100.0)
Platelets: 220 10*3/uL (ref 150–400)
RBC: 6.18 MIL/uL — ABNORMAL HIGH (ref 4.22–5.81)
RDW: 15.3 % (ref 11.5–15.5)
WBC: 8.5 10*3/uL (ref 4.0–10.5)
nRBC: 0 % (ref 0.0–0.2)

## 2019-07-17 LAB — LACTIC ACID, PLASMA: Lactic Acid, Venous: 1.4 mmol/L (ref 0.5–1.9)

## 2019-07-17 LAB — COMPREHENSIVE METABOLIC PANEL
ALT: 20 U/L (ref 0–44)
AST: 27 U/L (ref 15–41)
Albumin: 3.8 g/dL (ref 3.5–5.0)
Alkaline Phosphatase: 55 U/L (ref 38–126)
Anion gap: 9 (ref 5–15)
BUN: 13 mg/dL (ref 6–20)
CO2: 22 mmol/L (ref 22–32)
Calcium: 9.3 mg/dL (ref 8.9–10.3)
Chloride: 108 mmol/L (ref 98–111)
Creatinine, Ser: 1.39 mg/dL — ABNORMAL HIGH (ref 0.61–1.24)
GFR calc Af Amer: >60 mL/min (ref >60)
GFR calc non Af Amer: >60 mL/min (ref >60)
Glucose, Bld: 111 mg/dL — ABNORMAL HIGH (ref 70–99)
Potassium: 4.4 mmol/L (ref 3.5–5.1)
Sodium: 139 mmol/L (ref 135–145)
Total Bilirubin: 0.8 mg/dL (ref 0.3–1.2)
Total Protein: 6.9 g/dL (ref 6.5–8.1)

## 2019-07-17 LAB — CDS SEROLOGY

## 2019-07-17 LAB — RAPID URINE DRUG SCREEN, HOSP PERFORMED
Amphetamines: NOT DETECTED
Barbiturates: NOT DETECTED
Benzodiazepines: NOT DETECTED
Cocaine: NOT DETECTED
Opiates: NOT DETECTED
Tetrahydrocannabinol: NOT DETECTED

## 2019-07-17 LAB — SAMPLE TO BLOOD BANK

## 2019-07-17 LAB — PROTIME-INR
INR: 1 (ref 0.8–1.2)
Prothrombin Time: 13.3 seconds (ref 11.4–15.2)

## 2019-07-17 MED ORDER — HYDROCODONE-ACETAMINOPHEN 5-325 MG PO TABS
2.0000 | ORAL_TABLET | Freq: Once | ORAL | Status: AC
  Administered 2019-07-17: 2 via ORAL
  Filled 2019-07-17: qty 2

## 2019-07-17 MED ORDER — BACITRACIN ZINC 500 UNIT/GM EX OINT: 1.0000 "application " | TOPICAL_OINTMENT | Freq: Two times a day (BID) | CUTANEOUS | 0 refills | Status: DC

## 2019-07-17 MED ORDER — TETANUS-DIPHTH-ACELL PERTUSSIS 5-2.5-18.5 LF-MCG/0.5 IM SUSP
0.5000 mL | Freq: Once | INTRAMUSCULAR | Status: AC
  Administered 2019-07-17: 0.5 mL via INTRAMUSCULAR
  Filled 2019-07-17: qty 0.5

## 2019-07-17 MED ORDER — BACITRACIN ZINC 500 UNIT/GM EX OINT
1.0000 "application " | TOPICAL_OINTMENT | Freq: Two times a day (BID) | CUTANEOUS | Status: DC
  Administered 2019-07-17: 1 via TOPICAL

## 2019-07-17 MED ORDER — IOHEXOL 300 MG/ML  SOLN
125.0000 mL | Freq: Once | INTRAMUSCULAR | Status: AC | PRN
  Administered 2019-07-17: 125 mL via INTRAVENOUS
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
## 2019-07-17 NOTE — Discharge Instructions (Addendum)
1. Medications: Ibuprofen or Tylenol for pain/headache; bacitracin for left arm wound 2. Treatment: Rest, ice on head.  Concussion precautions given - keep patient in a quiet, not simulating, dark environment. No TV, computer use, video games until headache is resolved completely. No contact sports until cleared by the pediatrician. 3. Follow Up: With primary care physician in 2 days if headache persists.  Return to the emergency department if patient becomes lethargic, begins vomiting, develops double vision, speech difficulty, problems walking or other change in mental status.

## 2019-09-26 ENCOUNTER — Ambulatory Visit: Payer: Self-pay

## 2019-10-01 ENCOUNTER — Ambulatory Visit: Payer: Self-pay

## 2019-10-14 ENCOUNTER — Ambulatory Visit: Payer: BC Managed Care – PPO | Attending: Internal Medicine

## 2019-10-14 DIAGNOSIS — Z23 Encounter for immunization: Secondary | ICD-10-CM | POA: Insufficient documentation

## 2019-10-14 NOTE — Progress Notes (Signed)
   Covid-19 Vaccination Clinic  Name:  David Blake    MRN: [id number] DOB: 1985/11/02  10/14/2019  David Blake was observed post Covid-19 immunization for 15 minutes without incidence. He was provided with Vaccine Information Sheet and instruction to access the V-Safe system.   David Blake was instructed to call 911 with any severe reactions post vaccine: Marland Kitchen Difficulty breathing  . Swelling of your face and throat  . A fast heartbeat  . A bad rash all over your body  . Dizziness and weakness    Immunizations Administered    Name Date Dose VIS Date Route   Pfizer COVID-19 Vaccine 10/14/2019  5:49 PM 0.3 mL 07/28/2019 Intramuscular   Manufacturer: [employer], [employer]   Lot: [id number]   NDC: [address]-8

## 2019-10-27 ENCOUNTER — Ambulatory Visit: Payer: Managed Care, Other (non HMO) | Admitting: Podiatry

## 2019-10-30 ENCOUNTER — Ambulatory Visit: Payer: BC Managed Care – PPO | Admitting: Podiatrist

## 2019-10-30 ENCOUNTER — Other Ambulatory Visit: Payer: Self-pay

## 2019-10-30 ENCOUNTER — Encounter: Payer: Self-pay | Admitting: Podiatrist

## 2019-10-30 VITALS — BP 124/88 | HR 82 | Temp 97.8°F | Resp 16 | Ht 72.0 in | Wt 303.0 lb

## 2019-10-30 DIAGNOSIS — M2142 Flat foot [pes planus] (acquired), left foot: Secondary | ICD-10-CM

## 2019-10-30 DIAGNOSIS — M2141 Flat foot [pes planus] (acquired), right foot: Secondary | ICD-10-CM

## 2019-10-30 DIAGNOSIS — L600 Ingrowing nail: Secondary | ICD-10-CM | POA: Diagnosis not present

## 2019-10-30 DIAGNOSIS — M722 Plantar fascial fibromatosis: Secondary | ICD-10-CM | POA: Diagnosis not present

## 2019-10-30 DIAGNOSIS — F909 Attention-deficit hyperactivity disorder, unspecified type: Secondary | ICD-10-CM | POA: Insufficient documentation

## 2019-10-30 NOTE — Patient Instructions (Addendum)
Call any time to set up a permanent ingrown toenail procedure on one or both of your great toenails.  We will give you a call about the orthotics and when they are ready for pick up.

## 2019-10-31 ENCOUNTER — Encounter: Payer: Self-pay | Admitting: Podiatrist

## 2019-10-31 NOTE — Progress Notes (Signed)
  Chief Complaint  Patient presents with  . Nail Problem    Bilateral; Hallux-medial side; pt stated, "Been trimming nails-but that did not really help; soaking in epsom salt-helping"; x6+ months  . Foot Orthotics    Wants to discuss     HPI: Patient is 34 y.o. male who presents today for ingrowing bilateral hallux nails which has been bothersome for over 6 months.  He had them surgically removed in the past and now they are giving him trouble again.  He also states his feet hurt on the bottom when standing.  He has had orthotics in the past which were beneficial.  Today is also his birthday.   Review of Systems  DATA OBTAINED: from patient  GENERAL: Feels well no fevers, no fatigue, no changes in appetite SKIN: No itching, no rashes, no open wounds EYES: No eye pain,no redness, no discharge EARS: No earache,no ringing of ears, NOSE: No congestion, no drainage, no bleeding  MOUTH/THROAT: No mouth pain, No sore throat, No difficulty chewing or swallowing  RESPIRATORY: No cough, no wheezing, no SOB CARDIAC: No chest pain,no heart palpitations, GI: No abdominal pain, No Nausea, no vomiting, no diarrhea, no heartburn or no reflux  GU: No dysuria, no increased frequency or urgency MUSCULOSKELETAL: No unrelieved bone/joint pain,  NEUROLOGIC: Awake, alert, appropriate to situation, No change in mental status. PSYCHIATRIC: No overt anxiety or sadness.No behavior issue.      Physical Exam  GENERAL APPEARANCE: Alert, conversant. Appropriately groomed. No acute distress.   VASCULAR: Pedal pulses palpable DP and PT bilateral.  Capillary refill time is immediate to all digits,  Proximal to distal cooling it warm to warm.  Digital hair growth is present bilateral   NEUROLOGIC: sensation is intact epicritically and protectively to 5.07 monofilament at 5/5 sites bilateral.  Light touch is intact bilateral, vibratory sensation intact bilateral, achilles tendon reflex is intact bilateral.    MUSCULOSKELETAL: acceptable muscle strength, tone and stability bilateral.  Intrinsic muscluature intact bilateral.  Range of motion at ankle and first MPJ is normal bilateral.  A flatfoot type is noted bilateral.  Diffuse discomfort on the plantar aspect of bilateral feet at the insertion of the plantar fascia on the medial calcaneal tubercle is noted.  DERMATOLOGIC: skin is warm, supple, and dry.  No open lesions noted.  No interdigital maceration noted bilateral.  Evidence of a previous matrixectomy on both sides of bilateral halluces is noted.  No immediate sign of infection is seen.  Discomfort when pressure is placed on medial side of both hallux nails is noted with palpation and examination.     Assessment   #1 pes planovalgus on the bilateral #2 plantar fasciitis bilateral-chronic in nature #3 ingrown nail deformity medial aspect of bilateral halluces-noninfected  Plan  Discussed treatment options and alternatives with the patient.  Discussed that we could do a permanent phenol matrixectomy of the medial side of bilateral hallux nail borders and discussed the procedure in detail.  Because it is his birthday.  He would like to hold off and schedule this procedure for later date.  Also discussed the positive walk with orthotic therapy.  The patient is interested and a foam cast impression was taken of bilateral feet.  Our office will check benefits and call when the orthotics are ready for pickup.  Patient will call when he would like to schedule the nail procedure in the future.  If any concerns arise prior to that he will call.

## 2019-11-04 ENCOUNTER — Ambulatory Visit: Payer: No Typology Code available for payment source | Attending: Internal Medicine

## 2019-11-04 DIAGNOSIS — Z23 Encounter for immunization: Secondary | ICD-10-CM

## 2019-11-04 NOTE — Progress Notes (Signed)
   Covid-19 Vaccination Clinic  Name:  EBENEZER MCCASKEY    MRN: [id number] DOB: 04-20-1986  11/04/2019  Mr. Vogel was observed post Covid-19 immunization for 15 minutes without incident. He was provided with Vaccine Information Sheet and instruction to access the V-Safe system.   Mr. Hollenbeck was instructed to call 911 with any severe reactions post vaccine: Marland Kitchen Difficulty breathing  . Swelling of face and throat  . A fast heartbeat  . A bad rash all over body  . Dizziness and weakness   Immunizations Administered    Name Date Dose VIS Date Route   Pfizer COVID-19 Vaccine 11/04/2019 11:19 AM 0.3 mL 07/28/2019 Intramuscular   Manufacturer: [employer], [employer]   Lot: [id number]   NDC: [address]-7

## 2019-11-09 ENCOUNTER — Telehealth: Payer: Self-pay | Admitting: Podiatry

## 2019-11-09 NOTE — Telephone Encounter (Signed)
Pt aware of benefits and wants to proceed with the orthotics.Marland KitchenMarland Kitchen

## 2019-11-09 NOTE — Telephone Encounter (Signed)
Left message for pt to call to discuss orthotic benefits. °

## 2019-11-30 ENCOUNTER — Other Ambulatory Visit: Payer: Self-pay

## 2019-11-30 ENCOUNTER — Ambulatory Visit: Payer: BC Managed Care – PPO | Admitting: Orthotics

## 2019-11-30 ENCOUNTER — Ambulatory Visit: Payer: BC Managed Care – PPO | Admitting: Podiatry

## 2019-11-30 DIAGNOSIS — M722 Plantar fascial fibromatosis: Secondary | ICD-10-CM

## 2019-11-30 DIAGNOSIS — M2141 Flat foot [pes planus] (acquired), right foot: Secondary | ICD-10-CM

## 2019-11-30 NOTE — Progress Notes (Signed)

## 2019-12-07 ENCOUNTER — Ambulatory Visit: Payer: BC Managed Care – PPO | Admitting: Podiatry

## 2019-12-08 ENCOUNTER — Other Ambulatory Visit: Payer: Self-pay

## 2019-12-08 ENCOUNTER — Encounter: Payer: Self-pay | Admitting: Podiatry

## 2019-12-08 ENCOUNTER — Ambulatory Visit: Payer: BC Managed Care – PPO | Admitting: Podiatry

## 2019-12-08 DIAGNOSIS — L600 Ingrowing nail: Secondary | ICD-10-CM

## 2019-12-08 MED ORDER — NEOMYCIN-POLYMYXIN-HC 3.5-10000-1 OP SUSP: 4.0000 [drp] | Freq: Four times a day (QID) | OPHTHALMIC | 0 refills | Status: DC

## 2019-12-08 NOTE — Patient Instructions (Signed)

## 2019-12-11 NOTE — Progress Notes (Signed)
Subjective:   Patient ID: David Blake, male   DOB: 34 y.o.   MRN: [id number]   HPI Patient presents with chronic ingrown toenails of the big toe and states that they have been sore and would like to get them corrected.  States inserts are helping   ROS      Objective:  Physical Exam  Neurovascular status intact with chronic incurvation hallux nails medial border bilateral with pain no redness drainage noted and plantar fasciitis improved     Assessment:  Ingrown toenail deformity hallux bilateral localized with plantar fasciitis doing better upon palpation     Plan:  H&P discussed correction of the nailbeds patient wants this done.  I explained ultimately he may lose the entire nail but we will try just to do the medial corners and he does understand the fact it may need to be done completely.  I allowed him to read and signed consent form and after he reviewed it I did go ahead and infiltrated each hallux 60 mg like Marcaine mixture sterile prep applied remove the borders exposed matrix applied phenol 3 applications 30 seconds followed by alcohol lavage and sterile dressing.  Gave instructions for soaks encouraged him to leave dressing on 24 hours but take it off earlier if any throbbing were to occur and encouraged him to call with questions concerns

## 2020-06-26 ENCOUNTER — Telehealth: Payer: Self-pay | Admitting: Podiatrist

## 2020-06-26 DIAGNOSIS — M722 Plantar fascial fibromatosis: Secondary | ICD-10-CM

## 2020-06-26 NOTE — Telephone Encounter (Signed)
Pt called and is wanting a second pair of orthotics ordered. He originally asked for the 3/4 length and I asked what type of shoe he was going to use it in and if he wanted the same topcover as the full length and he said it was for his sneakers. I explained that we could do the 3/4 length but they are usually for a casual type shoe and they could be have the leather cover. He said it was hard to keep the current ones clean because his feet sweat a lot. I told him they would be 219.00 self pay. He decided to go with the full length.

## 2020-06-29 ENCOUNTER — Other Ambulatory Visit: Payer: Self-pay

## 2020-06-29 ENCOUNTER — Encounter (HOSPITAL_COMMUNITY): Payer: Self-pay | Admitting: Emergency Medicine

## 2020-06-29 ENCOUNTER — Emergency Department (HOSPITAL_COMMUNITY)
Admission: EM | Admit: 2020-06-29 | Discharge: 2020-06-29 | Disposition: A | Discharge status: Home / Self Care | Payer: BC Managed Care – PPO | Attending: Emergency Medicine | Admitting: Emergency Medicine

## 2020-06-29 DIAGNOSIS — J020 Streptococcal pharyngitis: Secondary | ICD-10-CM | POA: Insufficient documentation

## 2020-06-29 DIAGNOSIS — Z21 Asymptomatic human immunodeficiency virus [HIV] infection status: Secondary | ICD-10-CM | POA: Insufficient documentation

## 2020-06-29 DIAGNOSIS — J029 Acute pharyngitis, unspecified: Secondary | ICD-10-CM | POA: Diagnosis present

## 2020-06-29 LAB — GROUP A STREP BY PCR: Group A Strep by PCR: DETECTED — AB

## 2020-06-29 MED ORDER — LIDOCAINE VISCOUS HCL 2 % MT SOLN
15.0000 mL | Freq: Once | OROMUCOSAL | Status: AC
  Administered 2020-06-29: 15 mL via OROMUCOSAL
  Filled 2020-06-29: qty 15

## 2020-06-29 MED ORDER — PENICILLIN G BENZATHINE 1200000 UNIT/2ML IM SUSP
1.2000 10*6.[IU] | Freq: Once | INTRAMUSCULAR | Status: AC
  Administered 2020-06-29: 1.2 10*6.[IU] via INTRAMUSCULAR
  Filled 2020-06-29: qty 2

## 2020-06-29 MED ORDER — LIDOCAINE VISCOUS HCL 2 % MT SOLN: 15.0000 mL | OROMUCOSAL | 0 refills | Status: DC | PRN

## 2020-06-29 NOTE — ED Triage Notes (Signed)
Patient reports sore throat with swelling onset this week .  

## 2020-06-29 NOTE — ED Notes (Signed)
DC instructions reviewed with patient and patient verbalized understanding. Patient vss and nad.

## 2020-06-29 NOTE — Discharge Instructions (Signed)
Strep test was positive today, you have been treated for this. Gonorrhea/chlamydia oral swab should come back in 48-72 hours.  You will be contacted if positive. Take the prescribed medication as directed. Follow-up with your primary care doctor. Return to the ED for new or worsening symptoms.

## 2020-06-29 NOTE — ED Provider Notes (Signed)
[facility] EMERGENCY DEPARTMENT Provider Note   CSN: [id number] Arrival date & time: 06/29/20  0115     History Chief Complaint  Patient presents with  . Sore Throat    David Blake is a 34 y.o. male.  The history is provided by the patient and medical records.  Sore Throat    34 y.o. M with hx of HIV, presenting to the ED for sore throat.  States this has been ongoing since last evening.  He reports a lot of pain with swallowing and chills but no fever.  He did have a rapid covid test this AM that was negative.  Also had concern for possible STD of the throat as he did participate in oral sex earlier in the week.  Past Medical History:  Diagnosis Date  . HIV (human immunodeficiency virus infection) [facility])     Patient Active Problem List   Diagnosis Date Noted  . Adult attention deficit hyperactivity disorder 10/30/2019  . Allergic rhinitis 05/15/2015  . 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 Blood glucose abnormal 05/15/2015  . Human immunodeficiency virus infection (HCC) 05/15/2015  . Vitamin D deficiency 05/15/2015  . Pain in joint, ankle and foot 12/05/2012  . 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 Plantar fasciitis, bilateral 12/05/2012    Past Surgical History:  Procedure Laterality Date  . CIRCUMCISION    . INTUSSUSCEPTION REPAIR    . TONSILLECTOMY         No family history on file.  Social History   Tobacco Use  . Smoking status: Never Smoker  . Smokeless tobacco: Never Used  Substance Use Topics  . Alcohol use: No  . Drug use: No    Home Medications Prior to Admission medications   Medication Sig Start Date End Date Taking? Authorizing Provider  bictegravir-emtricitabine-tenofovir AF (BIKTARVY) 50-200-25 MG TABS tablet Take 1 tablet by mouth daily.    [provider]  buPROPion (WELLBUTRIN) 100 MG tablet Take 100 mg by mouth 2 (two) times daily.    [provider]  finasteride (PROPECIA) 1 MG tablet  Take 5 mg by mouth daily.    [provider]  neomycin-polymyxin-hydrocortisone (CORTISPORIN) 3.5-10000-1 ophthalmic suspension Place 4 drops into both eyes 4 (four) times daily. 12/08/19   Lenn Sink, DPM  valACYclovir (VALTREX) 500 MG tablet Take 1 tablet (500 mg total) by mouth 2 (two) times daily. outbreak 06/20/14   Charlynne Pander, MD    Allergies    Ciprofloxacin, Codeine, and Peanut butter flavor  Review of Systems   Review of Systems  HENT: Positive for sore throat.   All other systems reviewed and are negative.   Physical Exam Updated Vital Signs BP (!) 143/98 (BP Location: Left Arm)   Pulse 87   Temp 98.7 F (37.1 C) (Oral)   Resp 16   SpO2 99%   Physical Exam Vitals and nursing note reviewed.  Constitutional:      Appearance: He is well-developed.  HENT:     Head: Normocephalic and atraumatic.     Mouth/Throat:     Comments: Tonsils absent; uvula midline without evidence of peritonsillar abscess; posterior oropharynx appears raw and irritated handling secretions appropriately; no difficulty swallowing or speaking; normal phonation without stridor Eyes:     Conjunctiva/sclera: Conjunctivae normal.     Pupils: Pupils are equal, round, and reactive to light.  Cardiovascular:     Rate and Rhythm: Normal rate and regular rhythm.     Heart sounds: Normal heart  sounds.  Pulmonary:     Effort: Pulmonary effort is normal.     Breath sounds: Normal breath sounds.  Abdominal:     General: Bowel sounds are normal.     Palpations: Abdomen is soft.  Musculoskeletal:        General: Normal range of motion.     Cervical back: Normal range of motion.  Skin:    General: Skin is warm and dry.  Neurological:     Mental Status: He is alert and oriented to person, place, and time.     ED Results / Procedures / Treatments   Labs (all labs ordered are listed, but only abnormal results are displayed) Labs Reviewed  GROUP A STREP BY PCR - Abnormal; Notable  for the following components:      Result Value   Group A Strep by PCR DETECTED (*)    All other components within normal limits    EKG None  Radiology No results found.  Procedures Procedures (including critical care time)  Medications Ordered in ED Medications  penicillin g benzathine (BICILLIN LA) 1200000 UNIT/2ML injection 1.2 Million Units (1.2 Million Units Intramuscular Given 06/29/20 0322)  lidocaine (XYLOCAINE) 2 % viscous mouth solution 15 mL (15 mLs Mouth/Throat Given 06/29/20 0323)    ED Course  I have reviewed the triage vital signs and the nursing notes.  Pertinent labs & imaging results that were available during my care of the patient were reviewed by me and considered in my medical decision making (see chart for details).    MDM Rules/Calculators/A&P  34 year old male here with sore throat since yesterday afternoon.  States he had a rapid Covid test this morning that was negative.  Does have a lot of throat pain, especially with swallowing but no difficulty doing so.  He is afebrile and nontoxic.  Tonsils are absent, posterior oropharynx does appear irritated and raw.  Rapid strep is positive.  Will treat with course of Bicillin.  Patient also requested oral STD screening which has been sent.  He will be notified if positive.  Rx viscous lidocaine.  Close follow-up with PCP.  Return here for any new/acute changes.  Final Clinical Impression(s) / ED Diagnoses Final diagnoses:  Strep pharyngitis    Rx / DC Orders ED Discharge Orders         Ordered    lidocaine (XYLOCAINE) 2 % solution  As needed        06/29/20 0333           Garlon Hatchet, PA-C 06/29/20 8338    Nicanor Alcon, April, MD 06/29/20 0425

## 2020-07-01 LAB — GC/CHLAMYDIA PROBE AMP (~~LOC~~) NOT AT ARMC
Chlamydia: NEGATIVE
Comment: NEGATIVE
Comment: NORMAL
Neisseria Gonorrhea: NEGATIVE

## 2021-04-25 ENCOUNTER — Ambulatory Visit (INDEPENDENT_AMBULATORY_CARE_PROVIDER_SITE_OTHER): Payer: BC Managed Care – PPO

## 2021-04-25 ENCOUNTER — Other Ambulatory Visit: Payer: Self-pay

## 2021-04-25 DIAGNOSIS — Z23 Encounter for immunization: Secondary | ICD-10-CM

## 2021-04-25 NOTE — Progress Notes (Signed)
    [facility]  Name:  David Blake    MRN: [id number] DOB: 06-Jan-1986   04/25/2021  [name] was observed post [facility] immunization for 15 minutes without incident. He was provided with Vaccine Information Sheet and instruction to access the V-Safe system.   [name] was instructed to call 911 with any severe reactions post vaccine: Difficulty breathing  Swelling of face and throat  A fast heartbeat  A bad rash all over body  Dizziness and weakness

## 2021-07-23 ENCOUNTER — Other Ambulatory Visit (HOSPITAL_COMMUNITY): Payer: Self-pay

## 2021-07-23 MED ORDER — OZEMPIC (0.25 OR 0.5 MG/DOSE) 2 MG/1.5ML ~~LOC~~ SOPN
0.5000 mg | PEN_INJECTOR | SUBCUTANEOUS | 5 refills | Status: DC
  Filled 2021-07-23 – 2021-08-07 (×2): qty 1.5, 28d supply, fill #0

## 2021-07-24 ENCOUNTER — Other Ambulatory Visit (HOSPITAL_COMMUNITY): Payer: Self-pay

## 2021-07-25 ENCOUNTER — Other Ambulatory Visit (HOSPITAL_COMMUNITY): Payer: Self-pay

## 2021-07-28 ENCOUNTER — Other Ambulatory Visit (HOSPITAL_COMMUNITY): Payer: Self-pay

## 2021-07-29 ENCOUNTER — Other Ambulatory Visit (HOSPITAL_COMMUNITY): Payer: Self-pay

## 2021-08-04 ENCOUNTER — Other Ambulatory Visit (HOSPITAL_COMMUNITY): Payer: Self-pay

## 2021-08-07 ENCOUNTER — Other Ambulatory Visit (HOSPITAL_COMMUNITY): Payer: Self-pay

## 2021-08-12 ENCOUNTER — Other Ambulatory Visit (HOSPITAL_COMMUNITY): Payer: Self-pay

## 2021-08-14 ENCOUNTER — Other Ambulatory Visit (HOSPITAL_COMMUNITY): Payer: Self-pay

## 2021-08-19 ENCOUNTER — Other Ambulatory Visit (HOSPITAL_COMMUNITY): Payer: Self-pay

## 2021-08-25 ENCOUNTER — Other Ambulatory Visit (HOSPITAL_COMMUNITY): Payer: Self-pay

## 2021-10-22 ENCOUNTER — Encounter: Payer: Self-pay | Admitting: Primary Care

## 2021-10-22 ENCOUNTER — Ambulatory Visit (INDEPENDENT_AMBULATORY_CARE_PROVIDER_SITE_OTHER): Payer: BC Managed Care – PPO | Admitting: Primary Care

## 2021-10-22 ENCOUNTER — Other Ambulatory Visit: Payer: Self-pay

## 2021-10-22 VITALS — BP 134/76 | HR 85 | Temp 97.9°F | Ht 72.0 in | Wt 330.8 lb

## 2021-10-22 DIAGNOSIS — Z9189 Other specified personal risk factors, not elsewhere classified: Secondary | ICD-10-CM

## 2021-10-22 DIAGNOSIS — R0683 Snoring: Secondary | ICD-10-CM | POA: Diagnosis not present

## 2021-10-22 NOTE — Progress Notes (Unsigned)
@Patient  ID: David Blake, male    DOB: 1986/02/03, 36 y.o.   MRN: [id number]  Chief Complaint  Patient presents with   sleep consult    Pt is here for insominia. He states that he has has this for a few years now.     Referring provider: Willey Blade, MD  HPI: 36 year old male, never smoked. PMH   10/22/2021 Patient presents today for sleep consult. He has symptoms of insomnia and snoring. He has trouble staying asleep. He is a very light sleeper. He will fall asleep during the day if inactive. He goes to bed between 11-1am. It takes him minutes to fall asleep. He wakes up between 3-4 times a night to either use the restroom or d.t loud noise. He starts his day at 8am. Denies narcolepsy, cataplexy and    Sleep questionnaire Symptoms- Snoring, insomnia, restless sleep, daytime sleepiness  Prior sleep study- None  Bedtime- 11-1am  Time to fall asleep- within minutes  Nocturnal awakenings- 3-4 times Out of bed/start of day- 8am Weight changes- up 45 lbs  Do you operate heavy machinery- No Do you currently wear CPAP- No Do you current wear oxygen- No  Epworth- 17  Allergies  Allergen Reactions   Ciprofloxacin Hives   Codeine Hives   Peanut Butter Flavor     migraines    Immunization History  Administered Date(s) Administered   Influenza,inj,Quad PF,6+ Mos 06/12/2020   PFIZER Comirnaty(Gray Top)Covid-19 Tri-Sucrose Vaccine 04/04/2020   PFIZER(Purple Top)SARS-COV-2 Vaccination 10/14/2019, 11/04/2019   Pneumococcal Polysaccharide-23 10/27/2018   Tdap 01/12/2019, 07/17/2019   Vaccinia,smallpox Monkeypox Vaccine Live,pf 04/25/2021    Past Medical History:  Diagnosis Date   HIV (human immunodeficiency virus infection) ([place])     Tobacco History: Social History   Tobacco Use  Smoking Status Never  Smokeless Tobacco Never   Counseling given: Not Answered   Outpatient Medications Prior to Visit  Medication Sig Dispense Refill    bictegravir-emtricitabine-tenofovir AF (BIKTARVY) 50-200-25 MG TABS tablet Take 1 tablet by mouth daily.     buPROPion (WELLBUTRIN) 100 MG tablet Take 100 mg by mouth 2 (two) times daily.     Semaglutide, 1 MG/DOSE, (OZEMPIC, 1 MG/DOSE,) 2 MG/1.5ML SOPN Ozempic 1 mg/dose (2 mg/1.5 mL) subcutaneous pen injector  Inject 0.5 mg every week by subcutaneous route.     valACYclovir (VALTREX) 500 MG tablet Take 1 tablet (500 mg total) by mouth 2 (two) times daily. outbreak 30 tablet 0   finasteride (PROPECIA) 1 MG tablet Take 5 mg by mouth daily.     lidocaine (XYLOCAINE) 2 % solution Use as directed 15 mLs in the mouth or throat as needed for mouth pain. 150 mL 0   neomycin-polymyxin-hydrocortisone (CORTISPORIN) 3.5-10000-1 ophthalmic suspension Place 4 drops into both eyes 4 (four) times daily. 7.5 mL 0   No facility-administered medications prior to visit.      Review of Systems  Review of Systems   Physical Exam  BP 134/76 (BP Location: Left Arm, Patient Position: Sitting, Cuff Size: Normal)    Pulse 85    Temp 97.9 F (36.6 C) (Oral)    Ht 6' (1.829 m)    Wt (!) 330 lb 12.8 oz (150 kg)    SpO2 99%    BMI 44.86 kg/m  Physical Exam   Lab Results:  CBC    Component Value Date/Time   WBC 8.5 07/17/2019 0117   RBC 6.18 (H) 07/17/2019 0117   HGB 15.6 07/17/2019 0117   HCT  48.7 07/17/2019 0117   PLT 220 07/17/2019 0117   MCV 78.8 (L) 07/17/2019 0117   MCH 25.2 (L) 07/17/2019 0117   MCHC 32.0 07/17/2019 0117   RDW 15.3 07/17/2019 0117   LYMPHSABS 2.5 10/23/2010 1705   MONOABS 0.5 10/23/2010 1705   EOSABS 0.0 10/23/2010 1705   BASOSABS 0.0 10/23/2010 1705    BMET    Component Value Date/Time   NA 139 07/17/2019 0117   K 4.4 07/17/2019 0117   CL 108 07/17/2019 0117   CO2 22 07/17/2019 0117   GLUCOSE 111 (H) 07/17/2019 0117   BUN 13 07/17/2019 0117   CREATININE 1.39 (H) 07/17/2019 0117   CALCIUM 9.3 07/17/2019 0117   GFRNONAA >60 07/17/2019 0117   GFRAA >60 07/17/2019 0117     BNP No results found for: BNP  ProBNP No results found for: PROBNP  Imaging: No results found.   Assessment & Plan:   No problem-specific Assessment & Plan notes found for this encounter.     Martyn Ehrich, NP 10/22/2021

## 2021-10-22 NOTE — Patient Instructions (Signed)
Sleep apnea is defined as period of 10 seconds or longer when you stop breathing at night. This can happen multiple times a night. Dx sleep apnea is when this occurs more than 5 times an hour.  ?  ?Mild OSA 5-15 apneic events an hour ?Moderate OSA 15-30 apneic events an hour ?Severe OSA > 30 apneic events an hour ?  ?Untreated sleep apnea puts you at higher risk for cardiac arrhythmias, pulmonary HTN, stroke and diabetes ?  ?Treatment options include weight loss, side sleeping position, oral appliance, CPAP therapy or referral to ENT for possible surgical options  ?  ?Recommendations: ?Focus on side sleeping position ?Work on weight loss efforts  ?Do not drive if experiencing excessive daytime sleepiness of fatigue  ?  ?Orders: ?NPSG re: snoring  ?  ?Follow-up: ?4-6 week visit to review sleep study results and discuss treatment options further ?

## 2021-11-18 ENCOUNTER — Other Ambulatory Visit: Payer: Self-pay

## 2021-11-18 MED ORDER — WEGOVY 1 MG/0.5ML ~~LOC~~ SOAJ
SUBCUTANEOUS | 3 refills | Status: AC
  Filled 2021-11-18: qty 2, 28d supply, fill #0

## 2021-11-19 ENCOUNTER — Ambulatory Visit (HOSPITAL_BASED_OUTPATIENT_CLINIC_OR_DEPARTMENT_OTHER): Payer: BC Managed Care – PPO | Attending: Primary Care | Admitting: Pulmonary Disease

## 2021-11-19 DIAGNOSIS — G4733 Obstructive sleep apnea (adult) (pediatric): Secondary | ICD-10-CM | POA: Insufficient documentation

## 2021-11-19 DIAGNOSIS — R0683 Snoring: Secondary | ICD-10-CM | POA: Diagnosis not present

## 2021-11-19 DIAGNOSIS — Z9189 Other specified personal risk factors, not elsewhere classified: Secondary | ICD-10-CM | POA: Diagnosis not present

## 2021-11-19 DIAGNOSIS — I493 Ventricular premature depolarization: Secondary | ICD-10-CM | POA: Insufficient documentation

## 2021-11-20 ENCOUNTER — Other Ambulatory Visit: Payer: Self-pay

## 2021-11-20 MED ORDER — PHENTERMINE HCL 37.5 MG PO CAPS
ORAL_CAPSULE | ORAL | 2 refills | Status: AC
  Filled 2021-11-20: qty 30, 30d supply, fill #0

## 2021-11-20 MED ORDER — TOBRAMYCIN-DEXAMETHASONE 0.3-0.1 % OP SUSP
OPHTHALMIC | 1 refills | Status: AC
  Filled 2021-11-20: qty 5, 10d supply, fill #0

## 2021-11-21 ENCOUNTER — Other Ambulatory Visit: Payer: Self-pay

## 2021-11-25 DIAGNOSIS — G4733 Obstructive sleep apnea (adult) (pediatric): Secondary | ICD-10-CM | POA: Diagnosis not present

## 2021-11-25 NOTE — Procedures (Signed)
Patient Name: David Blake, David Blake ?Study Date: 11/19/2021 ?Gender: Male ?D.O.B: Nov 24, 1985 ?Age (years): 42 ?Referring Provider: Ames Dura NP ?Height (inches): 72 ?Interpreting Physician: Cyril Mourning MD, ABSM ?Weight (lbs): 325 ?RPSGT: Shelah Lewandowsky ?BMI: 45 ?MRN: [id number] ?Neck Size: 18.50 ?<br> <br> ?CLINICAL INFORMATION ?Sleep Study Type: NPSG ? ? ? ?Indication for sleep study: Daytime Fatigue, Depression, Insomnia, Obesity, Restless Sleep with Limb Movments, Snoring ? ? ? ?Epworth Sleepiness Score: 20 ? ? ? ?SLEEP STUDY TECHNIQUE ?As per the AASM Manual for the Scoring of Sleep and Associated Events v2.3 (April 2016) with a hypopnea requiring 4% desaturations. ? ?The channels recorded and monitored were frontal, central and occipital EEG, electrooculogram (EOG), submentalis EMG (chin), nasal and oral airflow, thoracic and abdominal wall motion, anterior tibialis EMG, snore microphone, electrocardiogram, and pulse oximetry. ? ?MEDICATIONS ?Medications self-administered by patient taken the night of the study : N/A ? ?SLEEP ARCHITECTURE ?The study was initiated at 10:40:49 PM and ended at 4:58:57 AM. ? ?Sleep onset time was 13.2 minutes and the sleep efficiency was 89.3%%. The total sleep time was 337.5 minutes. ? ?Stage REM latency was 61.0 minutes. ? ?The patient spent 8.4%% of the night in stage N1 sleep, 75.0%% in stage N2 sleep, 0.0%% in stage N3 and 16.6% in REM. ? ?Alpha intrusion was absent. ? ?Supine sleep was 31.40%. ? ?RESPIRATORY PARAMETERS ?The overall apnea/hypopnea index (AHI) was 17.2 per hour. There were 5 total apneas, including 5 obstructive, 0 central and 0 mixed apneas. There were 92 hypopneas and 14 RERAs. ? ?The AHI during Stage REM sleep was 54.6 per hour. ? ?AHI while supine was 22.1 per hour. ? ?The mean oxygen saturation was 93.7%. The minimum SpO2 during sleep was 86.0%. ? ?moderate snoring was noted during this study. ? ?CARDIAC DATA ?The 2 lead EKG demonstrated sinus rhythm. The  mean heart rate was 74.2 beats per minute. Other EKG findings include: PVCs. ? ?LEG MOVEMENT DATA ?The total PLMS were 0 with a resulting PLMS index of 0.0. Associated arousal with leg movement index was 0.0 . ? ?IMPRESSIONS ?- Moderate obstructive sleep apnea occurred during this study (AHI = 17.2/h). ?- Mild oxygen desaturation was noted during this study (Min O2 = 86.0%). ?- The patient snored with moderate snoring volume. ?- EKG findings include PVCs. ?- Clinically significant periodic limb movements did not occur during sleep. No significant associated arousals. ? ? ?DIAGNOSIS ?- Obstructive Sleep Apnea (G47.33) ? ? ?RECOMMENDATIONS ?- Options for treatment include CPAP or dental appliance. Auto CPAP can be tried ?- Avoid alcohol, sedatives and other CNS depressants that may worsen sleep apnea and disrupt normal sleep architecture. ?- Sleep hygiene should be reviewed to assess factors that may improve sleep quality. ?- Weight management and regular exercise should be initiated or continued if appropriate. ? ? ?Cyril Mourning MD ?Board Certified in Sleep medicine ? ?

## 2021-11-27 ENCOUNTER — Other Ambulatory Visit: Payer: Self-pay

## 2021-12-09 ENCOUNTER — Telehealth: Payer: Self-pay | Admitting: Primary Care

## 2021-12-09 NOTE — Assessment & Plan Note (Addendum)
-   Patient has symptoms of loud snoring and difficulty maintaining sleep. Epworth score 17. Concern patient has sleep apnea, needs polysomnography to evaluate for OSA. Discussed risks of untreated sleep apnea including cardiac arrhthymias, stroke, pulm HTN and DM. Briefly reviewed treatment options. Recommend weight loss and side sleeping position. Advised against driving if experiencing excessive daytime sleepiness. Needs FU in 4-6 weeks to review sleep study results and treatment options if needed.  ?

## 2021-12-09 NOTE — Telephone Encounter (Signed)
NPSG showed moderate obstructive sleep apnea occurred during this study (AHI = 17.2/h). Needs OV to discuss treatment options if not already scheduled  ?

## 2021-12-10 NOTE — Telephone Encounter (Signed)
Called and spoke with pt letting him know the info per BW and he verbalized understanding. Appt has been scheduled for pt with BW Fri. April 28. Nothing further needed. ?

## 2021-12-12 ENCOUNTER — Ambulatory Visit: Payer: BC Managed Care – PPO | Admitting: Primary Care

## 2021-12-23 ENCOUNTER — Encounter: Payer: Self-pay | Admitting: Primary Care

## 2021-12-23 ENCOUNTER — Telehealth (INDEPENDENT_AMBULATORY_CARE_PROVIDER_SITE_OTHER): Payer: BC Managed Care – PPO | Admitting: Primary Care

## 2021-12-23 DIAGNOSIS — G4733 Obstructive sleep apnea (adult) (pediatric): Secondary | ICD-10-CM | POA: Diagnosis not present

## 2021-12-23 HISTORY — DX: Obstructive sleep apnea (adult) (pediatric): G47.33

## 2021-12-23 NOTE — Patient Instructions (Signed)
Sleep study showed moderate obstructive sleep apnea, AHI 17.2/hr  ? ?Recommending CPAP therapy or oral appliance ? ?We will place an order for you to be started on auto CPAP 5-15cm h20. Aim to wear every night minimum 4-6 hours or longer. Work on weight loss efforts. Do not drive if experiencing excessive daytime sleepiness ? ?Follow-up: ?31-90 days after starting CPAP for compliance check  ?

## 2021-12-23 NOTE — Progress Notes (Signed)
Virtual Visit via Video Note ? ?I connected with David Blake on 12/23/21 at  9:30 AM EDT by a video enabled telemedicine application and verified that I am speaking with the correct person using two identifiers. ? ?Location: ?Patient: Home ?Provider: Office ?  ?I discussed the limitations of evaluation and management by telemedicine and the availability of in person appointments. The patient expressed understanding and agreed to proceed. ? ?History of Present Illness: ?36 year old male, never smoked. PMH significant for ADHD, allergic rhinitis, vit D deficiency, snoring. ? ?Previous [facility] pulmonary encounter:  ?10/22/2021 ?Patient presents today for sleep consult. He has symptoms of insomnia and snoring. He has trouble staying asleep. He is a very light sleeper. He will fall asleep during the day if inactive. He goes to bed between 11-1am. It takes him minutes to fall asleep. He wakes up 3-4 times a night to either use the restroom or due to loud noise. He starts his day at 8am. No prior sleep study. Epworth 17. Denies narcolepsy, cataplexy and sleep walking.  ? ?Sleep questionnaire ?Symptoms- Snoring, insomnia, restless sleep, daytime sleepiness  ?Prior sleep study- None  ?Bedtime- 11-1am  ?Time to fall asleep- within minutes  ?Nocturnal awakenings- 3-4 times ?Out of bed/start of day- 8am ?Weight changes- up 45 lbs  ?Do you operate heavy machinery- No ?Do you currently wear CPAP- No ?Do you current wear oxygen- No  ?Epworth- 17 ? ?12/23/2021- Interim hx  ?Patient contacted today to review sleep study results. Patient has symptoms of snoring, fragmented/restless sleep and daytime sleepiness. He had NPSG on 11/19/21 that showed evidence of moderate obstructive sleep apnea, overall AHI 17.2/hr (AHI REM -54.6/hr). SpO2 low 86%. We reviewed results and treatment options including weight loss, oral appliance, CPAP or referral to ENT for possible surgical options. Recommending he be started on auto CPAP d.t severity of his  symptoms, he is in agreement with plan. He has some concerns about cost of therapy. He is hopeful to get more rest.  ?  ?Observations/Objective: ? ?- Appears well, in bed. NO respiratory symptoms.  ? ?Assessment and Plan: ? ?Moderate sleep apnea: ?- Patient has symptoms of snoring, restless/fragmented sleep, daytime sleepiness  ?- NPSG 11/19/21 >> moderate obstructive sleep apnea, AHI 17.2/hr (REM -54.6/hr). SpO2 low 86% ?- Reviewed treatment options and risk of untreated sleep apnea. Placing an order for patient to be started on auto CPAP 5-15cm h20. Recommend he aim to wear CPAP every night for min 4-6 hours or longer. Advised against driving if experiencing excessive daytime sleepiness. Continue to encourage weight loss efforts.  ? ?Follow Up Instructions: ? ?- Patient to follow-up in 31-90 days after starting PAP therapy for compliance report ?  ?I discussed the assessment and treatment plan with the patient. The patient was provided an opportunity to ask questions and all were answered. The patient agreed with the plan and demonstrated an understanding of the instructions. ?  ?The patient was advised to call back or seek an in-person evaluation if the symptoms worsen or if the condition fails to improve as anticipated. ? ?I provided 22 minutes of non-face-to-face time during this encounter. ? ? ?Martyn Ehrich, NP ? ?

## 2021-12-24 NOTE — Progress Notes (Signed)
Reviewed and agree with assessment/plan. ? ? ?Shondell Fabel, MD ?[facility] ?12/24/2021, 8:23 AM ?Pager:  [phone] ? ?

## 2022-05-29 ENCOUNTER — Ambulatory Visit: Payer: BC Managed Care – PPO | Admitting: Podiatry

## 2022-05-29 DIAGNOSIS — M779 Enthesopathy, unspecified: Secondary | ICD-10-CM | POA: Diagnosis not present

## 2022-05-29 NOTE — Progress Notes (Signed)
Subjective:   Patient ID: Army Chaco, male   DOB: 36 y.o.   MRN: [id number]   HPI Patient presents stating he gets generalized pain in both his feet with history of inflammation and states orthotics have been beneficial but his orthotics have worn out   ROS      Objective:  Physical Exam  Neurovascular status intact with patient found to have flatfoot deformity bilateral with good range of motion mild equinus condition moderate obesity also noted and discomfort in the arch with prolonged standing     Assessment:  Structural changes tendinitis-like symptomatology with moderate obesity of patient      Plan:  Reviewed condition recommended long-term customized orthotics devices and is fitted for these today.  I then discussed the possibility for repeat doing his old orthotics and we will bring him in for evaluation at next visit

## 2022-06-03 ENCOUNTER — Telehealth: Payer: Self-pay | Admitting: Primary Care

## 2022-06-03 DIAGNOSIS — G4733 Obstructive sleep apnea (adult) (pediatric): Secondary | ICD-10-CM

## 2022-06-03 NOTE — Telephone Encounter (Signed)
New order placed per Wells Guiles with [facility] for patient to get his CPAP machine. Order placed. Nothing further needed

## 2022-06-03 NOTE — Telephone Encounter (Signed)
I just spoke with Wells Guiles with [facility] and she stated they would need new order for New Cpap setup because the patient CXL the order with them because he hadn't met his deductible.  Would someone place new Cpap order

## 2022-06-17 ENCOUNTER — Ambulatory Visit (INDEPENDENT_AMBULATORY_CARE_PROVIDER_SITE_OTHER): Payer: BC Managed Care – PPO

## 2022-06-17 DIAGNOSIS — M779 Enthesopathy, unspecified: Secondary | ICD-10-CM

## 2022-06-17 NOTE — Progress Notes (Signed)
Patient presents today to pick up custom molded foot orthotics, diagnosed with tendonitis by Dr. Paulla Dolly.   Orthotics were dispensed and fit was satisfactory. Reviewed instructions for break-in and wear. Written instructions given to patient.  Patient will follow up as needed.   [facility] - order # [id number]

## 2022-10-03 ENCOUNTER — Encounter (HOSPITAL_BASED_OUTPATIENT_CLINIC_OR_DEPARTMENT_OTHER): Payer: Self-pay | Admitting: Emergency Medicine

## 2022-10-03 ENCOUNTER — Emergency Department (HOSPITAL_BASED_OUTPATIENT_CLINIC_OR_DEPARTMENT_OTHER)
Admission: EM | Admit: 2022-10-03 | Discharge: 2022-10-04 | Disposition: A | Discharge status: Home / Self Care | Payer: BC Managed Care – PPO | Attending: Emergency Medicine | Admitting: Emergency Medicine

## 2022-10-03 ENCOUNTER — Other Ambulatory Visit: Payer: Self-pay

## 2022-10-03 DIAGNOSIS — Z91010 Allergy to peanuts: Secondary | ICD-10-CM | POA: Insufficient documentation

## 2022-10-03 DIAGNOSIS — R002 Palpitations: Secondary | ICD-10-CM | POA: Diagnosis present

## 2022-10-03 DIAGNOSIS — Z21 Asymptomatic human immunodeficiency virus [HIV] infection status: Secondary | ICD-10-CM | POA: Insufficient documentation

## 2022-10-03 DIAGNOSIS — R0789 Other chest pain: Secondary | ICD-10-CM | POA: Insufficient documentation

## 2022-10-03 NOTE — ED Triage Notes (Signed)
Pt reports a 1 week hx of feeling his heart racing and diaphoresis intermittently. He states he saw his PCP for this and EKG was normal at that time, no bloodwork done per his report. Today he states he woke up diaphoretic, with chest tightness and "feeling pulsing in back and ear". Pt states this is somewhat improved at present. Pt appears to be in no apparent distress.

## 2022-10-03 NOTE — ED Provider Notes (Signed)
[facility] Provider Note: Georgena Spurling, MD, FACEP  CSN: [id number] MRN: [id number] ARRIVAL: 10/03/22 at 2304 ROOM: [place]  Palpitations   HISTORY OF PRESENT ILLNESS  10/03/22 11:43 PM David Blake is a 37 y.o. male with 1 week of intermittent palpitations (heart racing) with associated diaphoresis.  The symptoms have become persistent and recurring at the present time.  He is having associated chest tightness which he rates as an 8 out of 10.  Nothing makes his symptoms better or worse.  He is not short of breath.  He also wishes to be tested for STIs.   Past Medical History:  Diagnosis Date   HIV (human immunodeficiency virus infection) ([place])    Moderate obstructive sleep apnea 12/23/2021    Past Surgical History:  Procedure Laterality Date   CIRCUMCISION     INTUSSUSCEPTION REPAIR     TONSILLECTOMY      History reviewed. No pertinent family history.  Social History   Tobacco Use   Smoking status: Never   Smokeless tobacco: Never  Vaping Use   Vaping Use: Never used  Substance Use Topics   Alcohol use: No   Drug use: No    Prior to Admission medications   Medication Sig Start Date End Date Taking? Authorizing Provider  bictegravir-emtricitabine-tenofovir AF (BIKTARVY) 50-200-25 MG TABS tablet Take 1 tablet by mouth daily.    [provider]  buPROPion (WELLBUTRIN) 100 MG tablet Take 100 mg by mouth 2 (two) times daily.    [provider]  phentermine 37.5 MG capsule Take 1 capsule by mouth in the morning. 11/20/21     Semaglutide, 1 MG/DOSE, (OZEMPIC, 1 MG/DOSE,) 2 MG/1.5ML SOPN Ozempic 1 mg/dose (2 mg/1.5 mL) subcutaneous pen injector  Inject 0.5 mg every week by subcutaneous route.    [provider]  Semaglutide-Weight Management (WEGOVY) 1 MG/0.5ML SOAJ Inject 1 mg every week by subcutaneous route. 11/18/21     tobramycin-dexamethasone (TOBRADEX) ophthalmic solution INSTILL 1 DROP INTO AFFECTED  EYE(S) BY OPHTHALMIC ROUTE EVERY 6 HOURS 11/20/21     valACYclovir (VALTREX) 500 MG tablet Take 1 tablet (500 mg total) by mouth 2 (two) times daily. outbreak 06/20/14   Drenda Freeze, MD    Allergies Ciprofloxacin, Codeine, and Peanut butter flavor   REVIEW OF SYSTEMS  Negative except as noted here or in the History of Present Illness.   PHYSICAL EXAMINATION  Initial Vital Signs Blood pressure (!) 137/95, pulse 75, temperature 98.5 F (36.9 C), resp. rate 20, height 5' 11.5" (1.816 m), weight (!) 142.9 kg, SpO2 97 %.  Examination General: Well-developed, well-nourished male in no acute distress; appearance consistent with age of record HENT: normocephalic; atraumatic Eyes: Normal appearance Neck: supple Heart: regular rate and rhythm; no ectopy noted Lungs: clear to auscultation bilaterally Abdomen: soft; nondistended; nontender; bowel sounds present Extremities: No deformity; full range of motion; pulses normal Neurologic: Awake, alert and oriented; motor function intact in all extremities and symmetric; no facial droop Skin: Warm and dry Psychiatric: Normal mood and affect   RESULTS  Summary of this visit's results, reviewed and interpreted by myself:   EKG Interpretation  Date/Time:  Saturday October 03 2022 23:24:06 EST Ventricular Rate:  75 PR Interval:  186 QRS Duration: 111 QT Interval:  386 QTC Calculation: 432 R Axis:   -38 Text Interpretation: Sinus rhythm Left axis deviation No previous ECGs available Confirmed by Eleftherios Dudenhoeffer [phone]) on 10/03/2022 11:43:37 PM  Laboratory Studies: Results for orders placed or performed during the hospital encounter of 10/03/22 (from the past 24 hour(s))  Magnesium     Status: None   Collection Time: 10/04/22 12:25 AM  Result Value Ref Range   Magnesium 2.1 1.7 - 2.4 mg/dL  Troponin I (High Sensitivity)     Status: None   Collection Time: 10/04/22  1:55 AM  Result Value Ref Range   Troponin I (High  Sensitivity) 6 <18 ng/L  Urinalysis, Routine w reflex microscopic -Urine, Clean Catch     Status: None   Collection Time: 10/04/22  1:56 AM  Result Value Ref Range   Color, Urine YELLOW YELLOW   APPearance CLEAR CLEAR   Specific Gravity, Urine 1.020 1.005 - 1.030   pH 6.5 5.0 - 8.0   Glucose, UA NEGATIVE NEGATIVE mg/dL   Hgb urine dipstick NEGATIVE NEGATIVE   Bilirubin Urine NEGATIVE NEGATIVE   Ketones, ur NEGATIVE NEGATIVE mg/dL   Protein, ur NEGATIVE NEGATIVE mg/dL   Nitrite NEGATIVE NEGATIVE   Leukocytes,Ua NEGATIVE NEGATIVE   Imaging Studies: DG Chest 2 View  Result Date: 10/04/2022 CLINICAL DATA:  Palpitations. EXAM: CHEST - 2 VIEW COMPARISON:  07/17/2019. FINDINGS: The heart size and mediastinal contours are within normal limits. Both lungs are clear. No acute osseous abnormality. IMPRESSION: No active cardiopulmonary disease. Electronically Signed   By: Brett Fairy M.D.   On: 10/04/2022 00:26    ED COURSE and MDM  Nursing notes, initial and subsequent vitals signs, including pulse oximetry, reviewed and interpreted by myself.  Vitals:   10/03/22 2328 10/04/22 0030 10/04/22 0130  BP: (!) 137/95 (!) 126/94 (!) 127/94  Pulse: 75 78 72  Resp: 20 (!) 22 (!) 24  Temp: 98.5 F (36.9 C)    SpO2: 97% 99% 100%  Weight: (!) 142.9 kg    Height: 5' 11.5" (1.816 m)     Medications - No data to display  11:56 PM The patient states he is symptomatic at the present time even though his heart is normal sinus rhythm with a rate of 78.  He states the symptoms are keeping him awake at night.  He can feel his heart beating in his back and ears and he has what he describes as a muscle pain in his left mid back.  2:58 AM Most of the patient's labs did not cross over into this note but his CBC is significant for microcytosis (MCV 77.7) without anemia.  Troponins are 7 and 6 respectively, showed no evidence of cardiac ischemia.  His EKG is nonischemic.  His symptoms are atypical for cardiac  etiology.  He has had no arrhythmias or ectopy the entire time he has been on the cardiac monitor.  There is thus no objective evidence of whatever is causing his palpitations.  He could be feeling some somatic muscle fasciculations as I have witnessed these causing patient's palpitations in the past.  PROCEDURES  Procedures   ED DIAGNOSES     ICD-10-CM   1. Palpitations  R00.2          Hawley Pavia, Jenny Reichmann, MD 10/04/22 [phone]

## 2022-10-04 ENCOUNTER — Emergency Department (HOSPITAL_BASED_OUTPATIENT_CLINIC_OR_DEPARTMENT_OTHER): Payer: BC Managed Care – PPO

## 2022-10-04 LAB — CBC WITH DIFFERENTIAL/PLATELET
Abs Immature Granulocytes: 0.02 10*3/uL (ref 0.00–0.07)
Basophils Absolute: 0 10*3/uL (ref 0.0–0.1)
Basophils Relative: 1 %
Eosinophils Absolute: 0.1 10*3/uL (ref 0.0–0.5)
Eosinophils Relative: 2 %
HCT: 44.5 % (ref 39.0–52.0)
Hemoglobin: 14.1 g/dL (ref 13.0–17.0)
Immature Granulocytes: 0 %
Lymphocytes Relative: 51 %
Lymphs Abs: 4.1 10*3/uL — ABNORMAL HIGH (ref 0.7–4.0)
MCH: 24.6 pg — ABNORMAL LOW (ref 26.0–34.0)
MCHC: 31.7 g/dL (ref 30.0–36.0)
MCV: 77.7 fL — ABNORMAL LOW (ref 80.0–100.0)
Monocytes Absolute: 0.5 10*3/uL (ref 0.1–1.0)
Monocytes Relative: 7 %
Neutro Abs: 3.1 10*3/uL (ref 1.7–7.7)
Neutrophils Relative %: 39 %
Platelets: 225 10*3/uL (ref 150–400)
RBC: 5.73 MIL/uL (ref 4.22–5.81)
RDW: 15.5 % (ref 11.5–15.5)
WBC: 8 10*3/uL (ref 4.0–10.5)
nRBC: 0 % (ref 0.0–0.2)

## 2022-10-04 LAB — BASIC METABOLIC PANEL
Anion gap: 5 (ref 5–15)
BUN: 11 mg/dL (ref 6–20)
CO2: 22 mmol/L (ref 22–32)
Calcium: 8.5 mg/dL — ABNORMAL LOW (ref 8.9–10.3)
Chloride: 108 mmol/L (ref 98–111)
Creatinine, Ser: 1.21 mg/dL (ref 0.61–1.24)
GFR, Estimated: >60 mL/min (ref >60)
Glucose, Bld: 121 mg/dL — ABNORMAL HIGH (ref 70–99)
Potassium: 3.5 mmol/L (ref 3.5–5.1)
Sodium: 135 mmol/L (ref 135–145)

## 2022-10-04 LAB — URINALYSIS, ROUTINE W REFLEX MICROSCOPIC
Bilirubin Urine: NEGATIVE
Glucose, UA: NEGATIVE mg/dL
Hgb urine dipstick: NEGATIVE
Ketones, ur: NEGATIVE mg/dL
Leukocytes,Ua: NEGATIVE
Nitrite: NEGATIVE
Protein, ur: NEGATIVE mg/dL
Specific Gravity, Urine: 1.02 (ref 1.005–1.030)
pH: 6.5 (ref 5.0–8.0)

## 2022-10-04 LAB — TROPONIN I (HIGH SENSITIVITY)
Troponin I (High Sensitivity): 6 ng/L (ref <18)
Troponin I (High Sensitivity): 7 ng/L (ref <18)

## 2022-10-04 LAB — TSH: TSH: 2.034 u[IU]/mL (ref 0.350–4.500)

## 2022-10-04 LAB — MAGNESIUM: Magnesium: 2.1 mg/dL (ref 1.7–2.4)

## 2022-10-05 LAB — GC/CHLAMYDIA PROBE AMP (~~LOC~~) NOT AT ARMC
Chlamydia: NEGATIVE
Comment: NEGATIVE
Comment: NORMAL
Neisseria Gonorrhea: NEGATIVE

## 2022-10-07 LAB — RPR: RPR Ser Ql: NONREACTIVE

## 2023-10-13 ENCOUNTER — Encounter: Payer: Self-pay | Admitting: Primary Care

## 2023-11-22 ENCOUNTER — Ambulatory Visit: Admitting: Podiatry

## 2024-01-19 ENCOUNTER — Encounter: Payer: Self-pay | Admitting: Podiatry

## 2024-01-19 ENCOUNTER — Ambulatory Visit (INDEPENDENT_AMBULATORY_CARE_PROVIDER_SITE_OTHER): Admitting: Podiatry

## 2024-01-19 DIAGNOSIS — M7752 Other enthesopathy of left foot: Secondary | ICD-10-CM | POA: Diagnosis not present

## 2024-01-19 DIAGNOSIS — M779 Enthesopathy, unspecified: Secondary | ICD-10-CM

## 2024-01-19 DIAGNOSIS — M7751 Other enthesopathy of right foot: Secondary | ICD-10-CM

## 2024-01-19 NOTE — Progress Notes (Signed)
 Subjective:   Patient ID: David Blake, male   DOB: 38 y.o.   MRN: [id number]   HPI Patient presents stating he needs new orthotics as his old ones have worn out flattened with obesity certainly complicating factor   ROS      Objective:  Physical Exam  Neurovascular status intact with patient having flatfoot deformity bilateral with inflammation pain in general secondary to the flattening of the arch     Assessment:  Chronic arch discomfort due to flatfeet with obesity with orthotics which do a good job of controlling symptoms     Plan:  H&P reviewed went ahead today reviewed x-rays indicating flatfeet bilateral spur formation casted for functional orthotics return when done

## 2024-01-20 NOTE — Progress Notes (Signed)
 Orthotic Scan  Foam impression taken today was scanned into [facility]/ [facility].    Composite device with deep heel seat ordered per Dr. Celia Coles.  Will call [facility] when orthotic is ready for fitting.

## 2024-02-02 ENCOUNTER — Telehealth: Payer: Self-pay

## 2024-02-02 NOTE — Telephone Encounter (Signed)
 LVM to schedule orthotic PU  Patient is responsible for the $542.00 balance that is due upon the pickup of his orthotics

## 2024-02-07 ENCOUNTER — Ambulatory Visit

## 2024-02-07 NOTE — Progress Notes (Signed)
 Patient presents today to pick up custom molded foot orthotics, diagnosed with tendonitis by Dr. Charlsie Merles.   Orthotics were dispensed and fit was satisfactory. Reviewed instructions for break-in and wear. Written instructions given to patient.  Patient will follow up as needed.  David Blake Cped, CFo, CFm

## 2024-03-20 ENCOUNTER — Encounter (HOSPITAL_BASED_OUTPATIENT_CLINIC_OR_DEPARTMENT_OTHER): Payer: Self-pay

## 2024-03-20 ENCOUNTER — Other Ambulatory Visit: Payer: Self-pay

## 2024-03-20 DIAGNOSIS — Z5321 Procedure and treatment not carried out due to patient leaving prior to being seen by health care provider: Secondary | ICD-10-CM | POA: Diagnosis not present

## 2024-03-20 DIAGNOSIS — M549 Dorsalgia, unspecified: Secondary | ICD-10-CM | POA: Insufficient documentation

## 2024-03-20 NOTE — ED Triage Notes (Signed)
 Pt has multiple complaints/concerns. Pt reports that he has had mid back pain > 1 week without specific injury. Pt also reports tingling in testicles x 3 weeks but denies dysuria. Pt saw his Infectious Disease provider last week and was tested for STD's but reports that all tests were negative. Pt also reports generalized fatigue that has been going on for quite a while.

## 2024-03-21 ENCOUNTER — Emergency Department (HOSPITAL_BASED_OUTPATIENT_CLINIC_OR_DEPARTMENT_OTHER)
Admission: EM | Admit: 2024-03-21 | Discharge: 2024-03-21 | Discharge status: Against Medical Advice | Attending: Emergency Medicine | Admitting: Emergency Medicine

## 2024-03-21 DIAGNOSIS — M549 Dorsalgia, unspecified: Secondary | ICD-10-CM

## 2024-03-21 NOTE — ED Notes (Signed)
 Pt unable to void at this time.

## 2024-03-21 NOTE — ED Notes (Addendum)
 Pt eloped from room.

## 2024-03-23 NOTE — ED Provider Notes (Signed)
 Patient eloped prior to my evaluation   Avry Roedl, Raynell Moder, MD 03/23/24 [phone]
